# Patient Record
Sex: Female | Born: 1990 | Hispanic: Yes | Marital: Single | Smoking: Never smoker
Health system: Southern US, Community
[De-identification: ages and names within clinical notes are randomized; demographics above are authoritative.]

## PROBLEM LIST (undated history)

## (undated) HISTORY — PX: NO PAST SURGERIES: SHX2092

---
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
## 2016-05-06 ENCOUNTER — Ambulatory Visit (INDEPENDENT_AMBULATORY_CARE_PROVIDER_SITE_OTHER): Payer: Worker's Compensation

## 2016-05-06 ENCOUNTER — Ambulatory Visit
Admission: EM | Admit: 2016-05-06 | Discharge: 2016-05-06 | Disposition: A | Discharge status: Home / Self Care | Payer: Worker's Compensation | Attending: Emergency Medicine | Admitting: Emergency Medicine

## 2016-05-06 DIAGNOSIS — S39012A Strain of muscle, fascia and tendon of lower back, initial encounter: Secondary | ICD-10-CM | POA: Diagnosis not present

## 2016-05-06 DIAGNOSIS — M62830 Muscle spasm of back: Secondary | ICD-10-CM

## 2016-05-06 MED ORDER — TRAMADOL HCL 50 MG PO TABS: ORAL_TABLET | ORAL | 0 refills | Status: DC

## 2016-05-06 MED ORDER — IBUPROFEN 800 MG PO TABS: 800.0000 mg | ORAL_TABLET | Freq: Three times a day (TID) | ORAL | 0 refills | Status: DC | PRN

## 2016-05-06 MED ORDER — TIZANIDINE HCL 4 MG PO TABS: 4.0000 mg | ORAL_TABLET | Freq: Three times a day (TID) | ORAL | 0 refills | Status: DC | PRN

## 2016-05-06 MED ORDER — KETOROLAC TROMETHAMINE 60 MG/2ML IM SOLN
60.0000 mg | Freq: Once | INTRAMUSCULAR | Status: AC
  Administered 2016-05-06: 60 mg via INTRAMUSCULAR

## 2016-05-06 NOTE — ED Provider Notes (Signed)
HPI  SUBJECTIVE:  April Mckee is a 25 y.o. female who presents with each onset of achy, sharp, right back pain starting while lifting a 20+ pound bag of candy and states that she was wrenched forward followed by pain. This occurred at 1530 today. She reports weakness in her right leg secondary to the pain and numbness down her entire right leg. She tried two ASA without improvement in symptoms. Symptoms are worse with bending forward and with any movement. She denies nausea, vomiting, fevers, saddle anesthesia, abdominal pain, urinary fecal incontinence or urinary retention. She has a past medical history of back injury, but does not recall any damage. No history of diabetes, hypertension, osteoporosis, cancer, multiple myeloma, GI bleed, kidney disease. LMP: 11/20. Denies possibility being pregnant. PMD: None.  History reviewed. No pertinent past medical history.  Past Surgical History:  Procedure Laterality Date  . NO PAST SURGERIES      History reviewed. No pertinent family history.  Social History  Substance Use Topics  . Smoking status: Never Smoker  . Smokeless tobacco: Never Used  . Alcohol use No    No current facility-administered medications for this encounter.   Current Outpatient Prescriptions:  .  ibuprofen (ADVIL,MOTRIN) 800 MG tablet, Take 1 tablet (800 mg total) by mouth every 8 (eight) hours as needed., Disp: 30 tablet, Rfl: 0 .  tiZANidine (ZANAFLEX) 4 MG tablet, Take 1 tablet (4 mg total) by mouth every 8 (eight) hours as needed for muscle spasms., Disp: 30 tablet, Rfl: 0 .  traMADol (ULTRAM) 50 MG tablet, 1-2 tabs po q 6 hr prn pain Maximum dose= 8 tablets per day, Disp: 20 tablet, Rfl: 0  No Known Allergies   ROS  As noted in HPI.   Physical Exam  BP 109/68 (BP Location: Right Arm)   Pulse 81   Temp 98.6 F (37 C) (Oral)   Resp 17   Ht 5' 1"  (1.549 m)   Wt 160 lb (72.6 kg)   LMP 04/21/2016   SpO2 99%   BMI 30.23 kg/m   Constitutional: Well  developed, well nourished, no acute distress Eyes:  EOMI, conjunctiva normal bilaterally HENT: Normocephalic, atraumatic,mucus membranes moist Respiratory: Normal inspiratory effort Cardiovascular: Normal rate GI: nondistended. No suprapubic tenderness skin: No rash, skin intact Musculoskeletal: no CVAT. + R paralumbar tenderness, +  muscle spasm. No bony tenderness. Bilateral lower extremities nontender, baseline ROM with intact DP pulses,. No pain with int/ext rotation. Pain aggravated with active hip flexion against resistance bilaterally.  Pain aggravated by right straight leg raise, but SLR neg bilaterally. Sensation baseline light touch bilaterally for Pt, DTR's symmetric and intact bilaterally KJ , Motor symmetric bilateral 5/5 hip flexion, quadriceps, hamstrings, EHL, foot dorsiflexion, foot plantarflexion, gait somewhat antalgic but without apparent new ataxia. Neurologic: Alert & oriented x 3, no focal neuro deficits Psychiatric: Speech and behavior appropriate   ED Course   Medications  ketorolac (TORADOL) injection 60 mg (60 mg Intramuscular Given 05/06/16 1711)    Orders Placed This Encounter  Procedures  . DG Lumbar Spine Complete    Standing Status:   Standing    Number of Occurrences:   1    Order Specific Question:   Reason for Exam (SYMPTOM  OR DIAGNOSIS REQUIRED)    Answer:   R LBP r/o fx dislocation    No results found for this or any previous visit (from the past 46 hour(s)). Dg Lumbar Spine Complete  Result Date: 05/06/2016 CLINICAL DATA:  25 year old female with  a jerking motion during working developing pain in lower right lumbar region. Initial encounter. EXAM: LUMBAR SPINE - COMPLETE 4+ VIEW COMPARISON:  None. FINDINGS: Mild scoliosis convex to the right. No fracture detected. No pars defect noted. No significant disc space narrowing. IMPRESSION: Mild scoliosis convex right. No fracture noted. Electronically Signed   By: Genia Del M.D.   On: 05/06/2016  18:27    ED Clinical Impression  Strain of lumbar region, initial encounter  Muscle spasm of back   ED Assessment/Plan  [place] narcotic database reviewed. Pt with no narcotic rx in the past 6 months.  .   Toradol 60 mg IM given 1.  Reviewed imaging independently. No fracture, dislocation. See radiology report for details.  Presentation most consistent with acute lumbar sprain/strain. Home with tramadol, ibuprofen, muscle relaxant, follow-up with tommie anne more, occupational health in 3 days. Writing for light duty for the next several days.    Discussed imaging, medical decision-making, and plan for follow-up with the patient.  Discussed signs and symptoms that should prompt return to the emergency department.  Patient agrees with plan.   *This clinic note was created using Dragon dictation software. Therefore, there may be occasional mistakes despite careful proofreading.  ?    Melynda Ripple, MD 05/06/16 [phone]

## 2016-05-06 NOTE — ED Notes (Signed)
Urine Drug screen obtained in office.

## 2016-05-06 NOTE — ED Triage Notes (Signed)
Patient complains of extreme back pain that started around 1 hour ago. Patient states that she was at work and was picking up a heavy load of candy to throw away and she was placing it on a cart, the cart then took off away from her and her body jerked while holding the heavy candy bag. Patient reports that she has numbness radiating down her right leg.

## 2016-05-09 ENCOUNTER — Telehealth: Payer: Self-pay

## 2016-05-09 NOTE — Telephone Encounter (Signed)
Courtesy call back completed today for patient's recent visit at [facility]. Patient did not answer, left message on machine to call back with any questions or concerns.   

## 2016-05-22 ENCOUNTER — Other Ambulatory Visit: Payer: Self-pay | Admitting: Family

## 2016-05-22 ENCOUNTER — Encounter: Payer: Self-pay | Admitting: *Deleted

## 2016-05-22 ENCOUNTER — Emergency Department
Admission: EM | Admit: 2016-05-22 | Discharge: 2016-05-22 | Disposition: A | Discharge status: Home / Self Care | Payer: Worker's Compensation | Attending: Student in an Organized Health Care Education/Training Program | Admitting: Student in an Organized Health Care Education/Training Program

## 2016-05-22 DIAGNOSIS — S3992XD Unspecified injury of lower back, subsequent encounter: Secondary | ICD-10-CM | POA: Diagnosis present

## 2016-05-22 DIAGNOSIS — Z79899 Other long term (current) drug therapy: Secondary | ICD-10-CM | POA: Diagnosis not present

## 2016-05-22 DIAGNOSIS — M5416 Radiculopathy, lumbar region: Secondary | ICD-10-CM

## 2016-05-22 DIAGNOSIS — Z791 Long term (current) use of non-steroidal anti-inflammatories (NSAID): Secondary | ICD-10-CM | POA: Insufficient documentation

## 2016-05-22 DIAGNOSIS — S39012D Strain of muscle, fascia and tendon of lower back, subsequent encounter: Secondary | ICD-10-CM | POA: Diagnosis not present

## 2016-05-22 DIAGNOSIS — X500XXD Overexertion from strenuous movement or load, subsequent encounter: Secondary | ICD-10-CM | POA: Insufficient documentation

## 2016-05-22 MED ORDER — HYDROMORPHONE HCL 1 MG/ML IJ SOLN
1.0000 mg | Freq: Once | INTRAMUSCULAR | Status: AC
  Administered 2016-05-22: 1 mg via INTRAMUSCULAR
  Filled 2016-05-22: qty 1

## 2016-05-22 MED ORDER — ORPHENADRINE CITRATE 30 MG/ML IJ SOLN
60.0000 mg | Freq: Two times a day (BID) | INTRAMUSCULAR | Status: DC
  Administered 2016-05-22: 60 mg via INTRAMUSCULAR
  Filled 2016-05-22: qty 2

## 2016-05-22 MED ORDER — NAPROXEN 500 MG PO TABS: 500.0000 mg | ORAL_TABLET | Freq: Two times a day (BID) | ORAL | Status: DC

## 2016-05-22 MED ORDER — METHOCARBAMOL 750 MG PO TABS: 750.0000 mg | ORAL_TABLET | Freq: Four times a day (QID) | ORAL | 0 refills | Status: DC

## 2016-05-22 NOTE — ED Notes (Signed)
See triage note  States she was lifting a box ar work on 12/5  Developed lower back pain  [facility] Then states pain started moving into right leg   Was seen yesterday by Ortho and placed on prednisone taper  States pain is worse this am and she voided on self this am denies any new injury

## 2016-05-22 NOTE — ED Provider Notes (Signed)
[facility] Emergency Department Provider Note   ____________________________________________   First MD Initiated Contact with Patient 05/22/16 1113     (approximate)  I have reviewed the triage vital signs and the nursing notes.   HISTORY  Chief Complaint Back Pain and Leg Pain    HPI April Mckee is a 25 y.o. female patient complaining of continued low back pain with radicular component to the right lower extremity. Plan is secondary to lifting incident at work on 05/06/2016. Patient had imaging done her back which shows only mild scoliosis convex to the right side. Patient has been seen by orthopedics and they started her on a prednisone dose pack yesterday. Patient also has a consult to physical therapy. Patient state noticed no improvement after starting the prednisone yesterday. She denies bowel dysfunction. Patient states she had one episode of urinary incontinence this morning. Patient has voided since the morning  episode without loss control the bladder. Patient rates her pain as a 9/10. Patient described the pain as "achy".   History reviewed. No pertinent past medical history.  There are no active problems to display for this patient.   Past Surgical History:  Procedure Laterality Date  . NO PAST SURGERIES      Prior to Admission medications   Medication Sig Start Date End Date Taking? Authorizing Provider  ibuprofen (ADVIL,MOTRIN) 800 MG tablet Take 1 tablet (800 mg total) by mouth every 8 (eight) hours as needed. 05/06/16   Domenick GongAshley Mortenson, MD  methocarbamol (ROBAXIN-750) 750 MG tablet Take 1 tablet (750 mg total) by mouth 4 (four) times daily. 05/22/16   Joni Reiningonald K Smith, PA-C  naproxen (NAPROSYN) 500 MG tablet Take 1 tablet (500 mg total) by mouth 2 (two) times daily with a meal. 05/22/16   Joni Reiningonald K Smith, PA-C  tiZANidine (ZANAFLEX) 4 MG tablet Take 1 tablet (4 mg total) by mouth every 8 (eight) hours as needed for muscle spasms. 05/06/16    Domenick GongAshley Mortenson, MD  traMADol (ULTRAM) 50 MG tablet 1-2 tabs po q 6 hr prn pain Maximum dose= 8 tablets per day 05/06/16   Domenick GongAshley Mortenson, MD    Allergies Patient has no known allergies.  History reviewed. No pertinent family history.  Social History Social History  Substance Use Topics  . Smoking status: Never Smoker  . Smokeless tobacco: Never Used  . Alcohol use No    Review of Systems Constitutional: No fever/chills Eyes: No visual changes. ENT: No sore throat. Cardiovascular: Denies chest pain. Respiratory: Denies shortness of breath. Gastrointestinal: No abdominal pain.  No nausea, no vomiting.  No diarrhea.  No constipation. Genitourinary: Negative for dysuria. Musculoskeletal: Positive for back pain. Skin: Negative for rash. Neurological: Negative for headaches, focal weakness or numbness.    ____________________________________________   PHYSICAL EXAM:  VITAL SIGNS: ED Triage Vitals  Enc Vitals Group     BP 05/22/16 1059 104/67     Pulse Rate 05/22/16 1059 60     Resp 05/22/16 1059 18     Temp 05/22/16 1059 98 F (36.7 C)     Temp Source 05/22/16 1059 Oral     SpO2 05/22/16 1059 100 %     Weight 05/22/16 1059 160 lb (72.6 kg)     Height 05/22/16 1059 5\' 1"  (1.549 m)     Head Circumference --      Peak Flow --      Pain Score 05/22/16 1100 9     Pain Loc --  Pain Edu? --      Excl. in GC? --     Constitutional: Alert and oriented. Well appearing and in no acute distress.Patient is is sitting on her left leg in a wheelchair upon my entrance into the exam room.  Eyes: Conjunctivae are normal. PERRL. EOMI. Head: Atraumatic. Nose: No congestion/rhinnorhea. Mouth/Throat: Mucous membranes are moist.  Oropharynx non-erythematous. Neck: No stridor.  No cervical spine tenderness to palpation. Hematological/Lymphatic/Immunilogical: No cervical lymphadenopathy. Cardiovascular: Normal rate, regular rhythm. Grossly normal heart sounds.  Good peripheral  circulation. Respiratory: Normal respiratory effort.  No retractions. Lungs CTAB. Gastrointestinal: Soft and nontender. No distention. No abdominal bruits. No CVA tenderness. Musculoskeletal: No obvious spinal deformity. No guarding palpation spinal processes. Patient has negative straight leg test. Patient has atypical gait without ataxia. Decreased range of motion with extension secondary to complain of pain. Right paraspinal muscle spasms with left lateral movements.  Neurologic:  Normal speech and language. No gross focal neurologic deficits are appreciated. No gait instability. Skin:  Skin is warm, dry and intact. No rash noted. Psychiatric: Mood and affect are normal. Speech and behavior are normal.  ____________________________________________   LABS (all labs ordered are listed, but only abnormal results are displayed)  Labs Reviewed - No data to display ____________________________________________  EKG   ____________________________________________  RADIOLOGY  Reviewed x-ray taken on 12 50 17 shows no acute findings. ____________________________________________   PROCEDURES  Procedure(s) performed: None  Procedures  Critical Care performed: No  ____________________________________________   INITIAL IMPRESSION / ASSESSMENT AND PLAN / ED COURSE  Pertinent labs & imaging results that were available during my care of the patient were reviewed by me and considered in my medical decision making (see chart for details).  Lumbar strain. Patient given discharge care instructions. Advised to follow-up with scheduled physical therapy and continue taking penicillin as directed. Patient given a prescription for Robaxin and naproxen. Advised to follow-up with treating doctor for his Worker's Comp. injury for continued care.  Clinical Course      ____________________________________________   FINAL CLINICAL IMPRESSION(S) / ED DIAGNOSES  Final diagnoses:  Strain of  lumbar region, subsequent encounter      NEW MEDICATIONS STARTED DURING THIS VISIT:  New Prescriptions   METHOCARBAMOL (ROBAXIN-750) 750 MG TABLET    Take 1 tablet (750 mg total) by mouth 4 (four) times daily.   NAPROXEN (NAPROSYN) 500 MG TABLET    Take 1 tablet (500 mg total) by mouth 2 (two) times daily with a meal.     Note:  This document was prepared using Dragon voice recognition software and may include unintentional dictation errors.    Joni Reiningonald K Smith, PA-C 05/22/16 1148    Willy EddyPatrick Robinson, MD 05/22/16 [phone]

## 2016-05-22 NOTE — ED Triage Notes (Signed)
States she had an injury on 12/5 at work and is having continued right sided back pain and leg pain, awake and alert

## 2016-05-23 ENCOUNTER — Ambulatory Visit: Payer: Self-pay

## 2016-05-24 ENCOUNTER — Ambulatory Visit
Admission: RE | Admit: 2016-05-24 | Discharge: 2016-05-24 | Disposition: A | Discharge status: Home / Self Care | Payer: Worker's Compensation | Source: Ambulatory Visit | Attending: Family | Admitting: Family

## 2016-05-24 DIAGNOSIS — M5126 Other intervertebral disc displacement, lumbar region: Secondary | ICD-10-CM | POA: Insufficient documentation

## 2016-05-24 DIAGNOSIS — R200 Anesthesia of skin: Secondary | ICD-10-CM | POA: Insufficient documentation

## 2016-05-24 DIAGNOSIS — R531 Weakness: Secondary | ICD-10-CM | POA: Insufficient documentation

## 2016-05-24 DIAGNOSIS — M5416 Radiculopathy, lumbar region: Secondary | ICD-10-CM | POA: Diagnosis not present

## 2016-09-15 ENCOUNTER — Other Ambulatory Visit: Payer: Self-pay | Admitting: Nurse Practitioner

## 2016-09-15 DIAGNOSIS — Z3401 Encounter for supervision of normal first pregnancy, first trimester: Secondary | ICD-10-CM

## 2016-09-19 ENCOUNTER — Emergency Department: Payer: Medicaid Other | Admitting: Certified Registered"

## 2016-09-19 ENCOUNTER — Encounter: Payer: Self-pay | Admitting: Emergency Medicine

## 2016-09-19 ENCOUNTER — Encounter
Admission: EM | Disposition: A | Discharge status: Home / Self Care | Payer: Self-pay | Source: Home / Self Care | Attending: Emergency Medicine

## 2016-09-19 ENCOUNTER — Emergency Department: Payer: Medicaid Other

## 2016-09-19 ENCOUNTER — Observation Stay
Admission: EM | Admit: 2016-09-19 | Discharge: 2016-09-20 | Disposition: A | Discharge status: Home / Self Care | Payer: Medicaid Other | Attending: Obstetrics & Gynecology | Admitting: Obstetrics & Gynecology

## 2016-09-19 DIAGNOSIS — Z3A00 Weeks of gestation of pregnancy not specified: Secondary | ICD-10-CM | POA: Diagnosis not present

## 2016-09-19 DIAGNOSIS — Z8759 Personal history of other complications of pregnancy, childbirth and the puerperium: Secondary | ICD-10-CM

## 2016-09-19 DIAGNOSIS — O00101 Right tubal pregnancy without intrauterine pregnancy: Secondary | ICD-10-CM | POA: Diagnosis present

## 2016-09-19 DIAGNOSIS — Z79899 Other long term (current) drug therapy: Secondary | ICD-10-CM | POA: Insufficient documentation

## 2016-09-19 DIAGNOSIS — O009 Unspecified ectopic pregnancy without intrauterine pregnancy: Secondary | ICD-10-CM | POA: Diagnosis present

## 2016-09-19 HISTORY — PX: LAPAROSCOPIC UNILATERAL SALPINGECTOMY: SHX5934

## 2016-09-19 HISTORY — PX: DIAGNOSTIC LAPAROSCOPY WITH REMOVAL OF ECTOPIC PREGNANCY: SHX6449

## 2016-09-19 LAB — CBC
HEMATOCRIT: 35.7 % (ref 35.0–47.0)
Hemoglobin: 12.3 g/dL (ref 12.0–16.0)
MCH: 31.2 pg (ref 26.0–34.0)
MCHC: 34.5 g/dL (ref 32.0–36.0)
MCV: 90.5 fL (ref 80.0–100.0)
Platelets: 323 10*3/uL (ref 150–440)
RBC: 3.95 MIL/uL (ref 3.80–5.20)
RDW: 12.5 % (ref 11.5–14.5)
WBC: 7.5 10*3/uL (ref 3.6–11.0)

## 2016-09-19 LAB — BASIC METABOLIC PANEL
Anion gap: 9 (ref 5–15)
BUN: 8 mg/dL (ref 6–20)
CALCIUM: 8.9 mg/dL (ref 8.9–10.3)
CO2: 22 mmol/L (ref 22–32)
CREATININE: 0.71 mg/dL (ref 0.44–1.00)
Chloride: 105 mmol/L (ref 101–111)
GFR calc non Af Amer: >60 mL/min (ref >60)
GLUCOSE: 137 mg/dL — AB (ref 65–99)
Potassium: 3 mmol/L — ABNORMAL LOW (ref 3.5–5.1)
Sodium: 136 mmol/L (ref 135–145)

## 2016-09-19 LAB — HCG, QUANTITATIVE, PREGNANCY: hCG, Beta Chain, Quant, S: 20506 m[IU]/mL — ABNORMAL HIGH (ref <5)

## 2016-09-19 LAB — HEMOGLOBIN: HEMOGLOBIN: 9.3 g/dL — AB (ref 12.0–16.0)

## 2016-09-19 LAB — PREPARE RBC (CROSSMATCH)

## 2016-09-19 LAB — TYPE AND SCREEN
ABO/RH(D): B POS
Antibody Screen: NEGATIVE

## 2016-09-19 LAB — ABO/RH: ABO/RH(D): B POS

## 2016-09-19 SURGERY — LAPAROSCOPY, WITH ECTOPIC PREGNANCY SURGICAL TREATMENT
Anesthesia: General | Site: Uterus | Laterality: Right | Wound class: Clean

## 2016-09-19 MED ORDER — LACTATED RINGERS IV SOLN
INTRAVENOUS | Status: DC
  Administered 2016-09-19 – 2016-09-20 (×2): via INTRAVENOUS

## 2016-09-19 MED ORDER — OXYCODONE-ACETAMINOPHEN 5-325 MG PO TABS
1.0000 | ORAL_TABLET | ORAL | Status: DC | PRN
  Administered 2016-09-19 – 2016-09-20 (×4): 2 via ORAL
  Filled 2016-09-19 (×4): qty 2

## 2016-09-19 MED ORDER — LIDOCAINE HCL (CARDIAC) 20 MG/ML IV SOLN
INTRAVENOUS | Status: DC | PRN
  Administered 2016-09-19: 50 mg via INTRAVENOUS

## 2016-09-19 MED ORDER — ONDANSETRON HCL 4 MG/2ML IJ SOLN
INTRAMUSCULAR | Status: AC
  Filled 2016-09-19: qty 2

## 2016-09-19 MED ORDER — SUCCINYLCHOLINE CHLORIDE 20 MG/ML IJ SOLN
INTRAMUSCULAR | Status: DC | PRN
  Administered 2016-09-19: 100 mg via INTRAVENOUS

## 2016-09-19 MED ORDER — ACETAMINOPHEN 10 MG/ML IV SOLN
INTRAVENOUS | Status: AC
  Filled 2016-09-19: qty 100

## 2016-09-19 MED ORDER — ROCURONIUM BROMIDE 100 MG/10ML IV SOLN
INTRAVENOUS | Status: AC
  Filled 2016-09-19: qty 1

## 2016-09-19 MED ORDER — MIDAZOLAM HCL 2 MG/2ML IJ SOLN
INTRAMUSCULAR | Status: AC
  Filled 2016-09-19: qty 2

## 2016-09-19 MED ORDER — MIDAZOLAM HCL 2 MG/2ML IJ SOLN
INTRAMUSCULAR | Status: DC | PRN
  Administered 2016-09-19: 2 mg via INTRAVENOUS

## 2016-09-19 MED ORDER — PROPOFOL 10 MG/ML IV BOLUS
INTRAVENOUS | Status: AC
  Filled 2016-09-19: qty 20

## 2016-09-19 MED ORDER — MORPHINE SULFATE (PF) 2 MG/ML IV SOLN
2.0000 mg | Freq: Once | INTRAVENOUS | Status: AC
  Administered 2016-09-19: 2 mg via INTRAVENOUS

## 2016-09-19 MED ORDER — PROPOFOL 10 MG/ML IV BOLUS
INTRAVENOUS | Status: DC | PRN
  Administered 2016-09-19: 200 mg via INTRAVENOUS

## 2016-09-19 MED ORDER — MORPHINE SULFATE (PF) 4 MG/ML IV SOLN
INTRAVENOUS | Status: AC
  Administered 2016-09-19: 4 mg via INTRAVENOUS
  Filled 2016-09-19: qty 1

## 2016-09-19 MED ORDER — FENTANYL CITRATE (PF) 100 MCG/2ML IJ SOLN
INTRAMUSCULAR | Status: AC
  Administered 2016-09-19: 25 ug via INTRAVENOUS
  Filled 2016-09-19: qty 2

## 2016-09-19 MED ORDER — ACETAMINOPHEN 10 MG/ML IV SOLN
INTRAVENOUS | Status: DC | PRN
  Administered 2016-09-19: 1000 mg via INTRAVENOUS

## 2016-09-19 MED ORDER — FENTANYL CITRATE (PF) 250 MCG/5ML IJ SOLN
INTRAMUSCULAR | Status: AC
  Filled 2016-09-19: qty 5

## 2016-09-19 MED ORDER — LACTATED RINGERS IV SOLN
INTRAVENOUS | Status: DC | PRN
  Administered 2016-09-19 (×2): via INTRAVENOUS

## 2016-09-19 MED ORDER — DEXAMETHASONE SODIUM PHOSPHATE 10 MG/ML IJ SOLN
INTRAMUSCULAR | Status: AC
  Filled 2016-09-19: qty 1

## 2016-09-19 MED ORDER — SODIUM CHLORIDE 0.9 % IV BOLUS (SEPSIS)
1000.0000 mL | Freq: Once | INTRAVENOUS | Status: AC
  Administered 2016-09-19: 1000 mL via INTRAVENOUS

## 2016-09-19 MED ORDER — EPINEPHRINE PF 1 MG/ML IJ SOLN
INTRAMUSCULAR | Status: AC
  Filled 2016-09-19: qty 1

## 2016-09-19 MED ORDER — ONDANSETRON HCL 4 MG/2ML IJ SOLN
INTRAMUSCULAR | Status: DC | PRN
  Administered 2016-09-19: 4 mg via INTRAVENOUS

## 2016-09-19 MED ORDER — SODIUM CHLORIDE 0.9 % IV SOLN: 10.0000 mL/h | Freq: Once | INTRAVENOUS | Status: DC

## 2016-09-19 MED ORDER — ROCURONIUM BROMIDE 100 MG/10ML IV SOLN
INTRAVENOUS | Status: DC | PRN
  Administered 2016-09-19: 40 mg via INTRAVENOUS
  Administered 2016-09-19: 10 mg via INTRAVENOUS

## 2016-09-19 MED ORDER — DEXAMETHASONE SODIUM PHOSPHATE 10 MG/ML IJ SOLN
INTRAMUSCULAR | Status: DC | PRN
  Administered 2016-09-19: 10 mg via INTRAVENOUS

## 2016-09-19 MED ORDER — ONDANSETRON HCL 4 MG/2ML IJ SOLN
4.0000 mg | Freq: Four times a day (QID) | INTRAMUSCULAR | Status: DC | PRN
Start: 2016-09-19 — End: 2016-09-20
  Administered 2016-09-19: 4 mg via INTRAVENOUS
  Filled 2016-09-19: qty 2

## 2016-09-19 MED ORDER — MORPHINE SULFATE (PF) 2 MG/ML IV SOLN
1.0000 mg | INTRAVENOUS | Status: DC | PRN
  Administered 2016-09-19: 2 mg via INTRAVENOUS
  Filled 2016-09-19: qty 1

## 2016-09-19 MED ORDER — EPHEDRINE SULFATE 50 MG/ML IJ SOLN
INTRAMUSCULAR | Status: DC | PRN
  Administered 2016-09-19 (×2): 10 mg via INTRAVENOUS

## 2016-09-19 MED ORDER — FENTANYL CITRATE (PF) 100 MCG/2ML IJ SOLN
25.0000 ug | INTRAMUSCULAR | Status: AC | PRN
  Administered 2016-09-19 (×6): 25 ug via INTRAVENOUS

## 2016-09-19 MED ORDER — FENTANYL CITRATE (PF) 100 MCG/2ML IJ SOLN
INTRAMUSCULAR | Status: DC | PRN
  Administered 2016-09-19 (×3): 50 ug via INTRAVENOUS
  Administered 2016-09-19: 100 ug via INTRAVENOUS

## 2016-09-19 MED ORDER — ONDANSETRON HCL 4 MG/2ML IJ SOLN: 4.0000 mg | Freq: Once | INTRAMUSCULAR | Status: DC | PRN

## 2016-09-19 MED ORDER — SUGAMMADEX SODIUM 200 MG/2ML IV SOLN
INTRAVENOUS | Status: DC | PRN
  Administered 2016-09-19: 150 mg via INTRAVENOUS

## 2016-09-19 MED ORDER — PHENYLEPHRINE HCL 10 MG/ML IJ SOLN
INTRAMUSCULAR | Status: DC | PRN
  Administered 2016-09-19 (×3): 100 ug via INTRAVENOUS
  Administered 2016-09-19 (×2): 200 ug via INTRAVENOUS
  Administered 2016-09-19 (×2): 100 ug via INTRAVENOUS

## 2016-09-19 MED ORDER — SIMETHICONE 80 MG PO CHEW
80.0000 mg | CHEWABLE_TABLET | Freq: Four times a day (QID) | ORAL | Status: DC | PRN
Start: 2016-09-19 — End: 2016-09-20

## 2016-09-19 MED ORDER — BUPIVACAINE HCL (PF) 0.5 % IJ SOLN
INTRAMUSCULAR | Status: DC | PRN
  Administered 2016-09-19: 10 mL

## 2016-09-19 MED ORDER — LIDOCAINE HCL (PF) 2 % IJ SOLN
INTRAMUSCULAR | Status: AC
  Filled 2016-09-19: qty 2

## 2016-09-19 MED ORDER — ONDANSETRON HCL 4 MG PO TABS: 4.0000 mg | ORAL_TABLET | Freq: Four times a day (QID) | ORAL | Status: DC | PRN

## 2016-09-19 MED ORDER — ACETAMINOPHEN 325 MG PO TABS: 650.0000 mg | ORAL_TABLET | ORAL | Status: DC | PRN

## 2016-09-19 MED ORDER — ONDANSETRON HCL 4 MG/2ML IJ SOLN
INTRAMUSCULAR | Status: AC
  Administered 2016-09-19: 4 mg via INTRAVENOUS
  Filled 2016-09-19: qty 2

## 2016-09-19 MED ORDER — MORPHINE SULFATE (PF) 2 MG/ML IV SOLN
INTRAVENOUS | Status: AC
  Administered 2016-09-19: 2 mg via INTRAVENOUS
  Filled 2016-09-19: qty 1

## 2016-09-19 MED ORDER — SUGAMMADEX SODIUM 200 MG/2ML IV SOLN
INTRAVENOUS | Status: AC
  Filled 2016-09-19: qty 2

## 2016-09-19 MED ORDER — MORPHINE SULFATE (PF) 4 MG/ML IV SOLN
4.0000 mg | Freq: Once | INTRAVENOUS | Status: AC
  Administered 2016-09-19: 4 mg via INTRAVENOUS

## 2016-09-19 MED ORDER — ONDANSETRON HCL 4 MG/2ML IJ SOLN
4.0000 mg | Freq: Once | INTRAMUSCULAR | Status: AC
  Administered 2016-09-19: 4 mg via INTRAVENOUS

## 2016-09-19 SURGICAL SUPPLY — 42 items
ANCHOR TIS RET SYS 235ML (MISCELLANEOUS) ×4 IMPLANT
APPLICATOR COTTON TIP 6IN STRL (MISCELLANEOUS) ×4 IMPLANT
BLADE SURG SZ11 CARB STEEL (BLADE) ×4 IMPLANT
CANISTER SUCT 1200ML W/VALVE (MISCELLANEOUS) ×4 IMPLANT
CATH FOLEY 2WAY  5CC 16FR (CATHETERS) ×2
CATH ROBINSON RED A/P 16FR (CATHETERS) ×4 IMPLANT
CATH URTH 16FR FL 2W BLN LF (CATHETERS) ×2 IMPLANT
CHLORAPREP W/TINT 26ML (MISCELLANEOUS) ×4 IMPLANT
DERMABOND ADVANCED (GAUZE/BANDAGES/DRESSINGS) ×2
DERMABOND ADVANCED .7 DNX12 (GAUZE/BANDAGES/DRESSINGS) ×2 IMPLANT
DRSG TEGADERM 2-3/8X2-3/4 SM (GAUZE/BANDAGES/DRESSINGS) ×12 IMPLANT
DRSG TELFA 4X3 1S NADH ST (GAUZE/BANDAGES/DRESSINGS) ×4 IMPLANT
ENDOPOUCH RETRIEVER 10 (MISCELLANEOUS) ×4 IMPLANT
GAUZE SPONGE NON-WVN 2X2 STRL (MISCELLANEOUS) ×2 IMPLANT
GLOVE BIO SURGEON STRL SZ8 (GLOVE) ×4 IMPLANT
GLOVE INDICATOR 8.0 STRL GRN (GLOVE) ×4 IMPLANT
GOWN STRL REUS W/ TWL LRG LVL3 (GOWN DISPOSABLE) ×2 IMPLANT
GOWN STRL REUS W/ TWL XL LVL3 (GOWN DISPOSABLE) ×2 IMPLANT
GOWN STRL REUS W/TWL LRG LVL3 (GOWN DISPOSABLE) ×2
GOWN STRL REUS W/TWL XL LVL3 (GOWN DISPOSABLE) ×2
IRRIGATION STRYKERFLOW (MISCELLANEOUS) ×2 IMPLANT
IRRIGATOR STRYKERFLOW (MISCELLANEOUS) ×4
IV LACTATED RINGERS 1000ML (IV SOLUTION) ×4 IMPLANT
KIT PINK PAD W/HEAD ARE REST (MISCELLANEOUS) ×4
KIT PINK PAD W/HEAD ARM REST (MISCELLANEOUS) ×2 IMPLANT
LABEL OR SOLS (LABEL) ×4 IMPLANT
NEEDLE VERESS 14GA 120MM (NEEDLE) ×4 IMPLANT
NS IRRIG 500ML POUR BTL (IV SOLUTION) ×4 IMPLANT
PACK GYN LAPAROSCOPIC (MISCELLANEOUS) ×4 IMPLANT
PAD PREP 24X41 OB/GYN DISP (PERSONAL CARE ITEMS) ×4 IMPLANT
SCISSORS METZENBAUM CVD 33 (INSTRUMENTS) ×4 IMPLANT
SHEARS HARMONIC ACE PLUS 36CM (ENDOMECHANICALS) ×4 IMPLANT
SLEEVE ENDOPATH XCEL 5M (ENDOMECHANICALS) ×4 IMPLANT
SPONGE VERSALON 2X2 STRL (MISCELLANEOUS) ×2
STRAP SAFETY BODY (MISCELLANEOUS) ×4 IMPLANT
SUT VIC AB 2-0 UR6 27 (SUTURE) ×4 IMPLANT
SUT VIC AB 4-0 PS2 18 (SUTURE) ×4 IMPLANT
SUT VICRYL 0 AB UR-6 (SUTURE) ×4 IMPLANT
SYRINGE 10CC LL (SYRINGE) ×4 IMPLANT
TROCAR ENDO BLADELESS 11MM (ENDOMECHANICALS) ×4 IMPLANT
TROCAR XCEL NON-BLD 5MMX100MML (ENDOMECHANICALS) ×4 IMPLANT
TUBING INSUFFLATOR HI FLOW (MISCELLANEOUS) ×4 IMPLANT

## 2016-09-19 NOTE — ED Notes (Signed)
Last PO intake today at approximately 1100.  Patient states she ate eggs.

## 2016-09-19 NOTE — ED Notes (Signed)
2L/NS running.  Dr. Cyril Loosen notified of BP 80/47

## 2016-09-19 NOTE — ED Notes (Signed)
Dr. Roxan Hockey in room, c/o lower abd pain without bleeding or DC. Has not had an [place] to confirm.

## 2016-09-19 NOTE — Op Note (Signed)
  Operative Note   09/19/2016  PRE-OP DIAGNOSIS: RIGHT ECTOPIC PREGNANCY  POST-OP DIAGNOSIS: same   PROCEDURE: Procedure(s): DIAGNOSTIC LAPAROSCOPY WITH REMOVAL OF ECTOPIC PREGNANCY LAPAROSCOPIC RIGHT SALPINGECTOMY   SURGEON: Annamarie Major, MD, FACOG  ANESTHESIA: General   ESTIMATED BLOOD LOSS: 1500 mL blood removed from ruptured ectopic pregnancy  COMPLICATIONS: None  DISPOSITION: PACU - hemodynamically stable.  CONDITION: stable  FINDINGS: Laparoscopic survey of the abdomen revealed significant pelvic and intra-abdominal adhesions were noted.  Ovaries were encased in adhesions, but seen as normal size.  RIGHT FALLOPIAN TUBE severely edematous and actively bleeding bright red blood from point of rupture.  Normal appendix, liver, GB.  PROCEDURE IN DETAIL: The patient was taken to the OR where anesthesia was administed. The patient was positioned in dorsal lithotomy in the [place] stirrups. The patient was then examined under anesthesia with the above noted findings. The patient was prepped and draped in the normal sterile fashion and bladder was drained using a red rubber cathater. Speculum exam normal, and a  Hulka tenaculum was placed for manipulation purposes.  Attention was turned to the patient's abdomen where a 5 mm skin incision was made in the umbilical fold, after injection of local anesthesia. The Veress step needle was carefully introduced into the peritoneal cavity with placement confirmed using the hanging drop technique.  Pneumoperitoneum was obtained. The 5 mm port was then placed under direct visualization with the operative laparoscope  The above noted findings.  Trendelenburg.  A 5 mm trocar was then placed in the left lower quadrant under direct visualization with the laparoscope. A suprapubic 11 mm trocar was also placed.   Above mentioned findings were observed.  Aspiration of blood and clot performed.  Right tube damaged beyond repair from ectopic and rupture, so it is  carefully excised using the harmonic scapel.  Its blood supply is transected, with preservation of main blood supply to ovary.  No injury to bowel or bladder or ureter observed.  No further bleeding was noted.  All instruments and ports were then removed from the abdomen after gas was expelled and patient was leveled.  The fascia at the larger incision is closed with a 0 Vicryl suture and skin with 4-0 Vicryl suture.  The skin was closed with  skin adhesive. The patient tolerated the procedure well. Hulka tenaculum removed.  All counts were correct x 2. The patient was transferred to the recovery room awake, alert and breathing independently.

## 2016-09-19 NOTE — Anesthesia Procedure Notes (Signed)
Procedure Name: Intubation Performed by: Mathews Argyle Pre-anesthesia Checklist: Patient identified, Patient being monitored, Timeout performed, Emergency Drugs available and Suction available Patient Re-evaluated:Patient Re-evaluated prior to inductionOxygen Delivery Method: Circle system utilized Preoxygenation: Pre-oxygenation with 100% oxygen Intubation Type: IV induction, Rapid sequence and Cricoid Pressure applied Laryngoscope Size: Miller and 2 Grade View: Grade I Tube type: Oral Tube size: 7.0 mm Number of attempts: 1 Placement Confirmation: ETT inserted through vocal cords under direct vision,  positive ETCO2 and breath sounds checked- equal and bilateral Secured at: 20 cm Tube secured with: Tape Dental Injury: Teeth and Oropharynx as per pre-operative assessment

## 2016-09-19 NOTE — ED Triage Notes (Signed)
Pt to ED c/o lower abd cramping started 30 min PTA that is constant.  Patient is pregnant, LMP approx January 31st, first pregnancy.  Denies bleeding.  Pt reports feeling weak and dehydrated.

## 2016-09-19 NOTE — Anesthesia Postprocedure Evaluation (Signed)
Anesthesia Post Note  Patient: April Mckee  Procedure(s) Performed: Procedure(s) (LRB): DIAGNOSTIC LAPAROSCOPY WITH REMOVAL OF ECTOPIC PREGNANCY (Right) LAPAROSCOPIC RIGHT SALPINGECTOMY (Right)  Patient location during evaluation: PACU Anesthesia Type: General Level of consciousness: awake and alert Pain management: pain level controlled Vital Signs Assessment: post-procedure vital signs reviewed and stable Respiratory status: spontaneous breathing, nonlabored ventilation, respiratory function stable and patient connected to nasal cannula oxygen Cardiovascular status: blood pressure returned to baseline and stable Postop Assessment: no signs of nausea or vomiting Anesthetic complications: no     Last Vitals:  Vitals:   09/19/16 1719 09/19/16 1724  BP:  120/79  Pulse: (!) 117   Resp: 10   Temp:  36.3 C    Last Pain:  Vitals:   09/19/16 1724  TempSrc:   PainSc: 5                  Cleda Mccreedy Piscitello

## 2016-09-19 NOTE — Anesthesia Preprocedure Evaluation (Signed)
Anesthesia Evaluation  Patient identified by MRN, date of birth, ID band Patient awake    Reviewed: Allergy & Precautions, NPO status , Patient's Chart, lab work & pertinent test results, reviewed documented beta blocker date and time   Airway Mallampati: III  TM Distance: >3 FB     Dental  (+) Chipped   Pulmonary           Cardiovascular      Neuro/Psych    GI/Hepatic   Endo/Other    Renal/GU      Musculoskeletal   Abdominal   Peds  Hematology   Anesthesia Other Findings   Reproductive/Obstetrics                             Anesthesia Physical Anesthesia Plan  ASA: III  Anesthesia Plan: General   Post-op Pain Management:    Induction: Intravenous, Rapid sequence and Cricoid pressure planned  Airway Management Planned: Oral ETT  Additional Equipment:   Intra-op Plan:   Post-operative Plan:   Informed Consent: I have reviewed the patients History and Physical, chart, labs and discussed the procedure including the risks, benefits and alternatives for the proposed anesthesia with the patient or authorized representative who has indicated his/her understanding and acceptance.     Plan Discussed with: CRNA  Anesthesia Plan Comments:         Anesthesia Quick Evaluation

## 2016-09-19 NOTE — H&P (Addendum)
Obstetrics & Gynecology History and Physical Note  Date of Consultation: 09/19/2016   Requesting Provider: [facility]  Primary OBGYN: [facility] Primary Care Provider: [facility]  Reason for Consultation: Lower abdominal Pain, First trimester  History of Present Illness: April Mckee is a 26 y.o. G1P0 (Patient's last menstrual period was 07/02/2016 (approximate).), with the above CC. She has had PNC at [facility] without difficulty to date.  This am noted lower abdominal pains.  No bleeding.  Pain is sharp and shooting especially on right side, 9/10 intensity, no radiation, positioning makes it better and worse, no other modifiers, no associated findings, context of pregnancy. No other prior medical conditions.  ROS: A review of systems was performed and negative, except as stated in the above HPI.  OBGYN History: As per HPI. OB History    Gravida Para Term Preterm AB Living   1             SAB TAB Ectopic Multiple Live Births                   Past Medical History: History reviewed. No pertinent past medical history.  Past Surgical History: Past Surgical History:  Procedure Laterality Date  . NO PAST SURGERIES      Family History:  History reviewed. No pertinent family history. She denies any female cancers, bleeding or blood clotting disorders.   Social History:  Social History   Social History  . Marital status: Single    Spouse name: N/A  . Number of children: N/A  . Years of education: N/A   Occupational History  . Not on file.   Social History Main Topics  . Smoking status: Never Smoker  . Smokeless tobacco: Never Used  . Alcohol use No  . Drug use: No  . Sexual activity: Not on file   Other Topics Concern  . Not on file   Social History Narrative  . No narrative on file    Allergy: No Known Allergies  Current Outpatient Medications:  (Not in a hospital admission)  Hospital Medications: Current Facility-Administered Medications   Medication Dose Route Frequency Provider Last Rate Last Dose  . 0.9 %  sodium chloride infusion  10 mL/hr Intravenous Once Jene Every, MD       Current Outpatient Prescriptions  Medication Sig Dispense Refill  . Prenatal Vit-DSS-Fe Cbn-FA (PRENATAL AD PO) Take 1 tablet by mouth daily.    Marland Kitchen PRESCRIPTION MEDICATION Take 1 capsule by mouth 3 (three) times daily. Prescribed antibiotic    . ibuprofen (ADVIL,MOTRIN) 800 MG tablet Take 1 tablet (800 mg total) by mouth every 8 (eight) hours as needed. (Patient not taking: Reported on 09/19/2016) 30 tablet 0  . naproxen (NAPROSYN) 500 MG tablet Take 1 tablet (500 mg total) by mouth 2 (two) times daily with a meal. (Patient not taking: Reported on 09/19/2016) 20 tablet 00  . tiZANidine (ZANAFLEX) 4 MG tablet Take 1 tablet (4 mg total) by mouth every 8 (eight) hours as needed for muscle spasms. (Patient not taking: Reported on 09/19/2016) 30 tablet 0  . traMADol (ULTRAM) 50 MG tablet 1-2 tabs po q 6 hr prn pain Maximum dose= 8 tablets per day (Patient not taking: Reported on 09/19/2016) 20 tablet 0    Physical Exam: Vitals:   09/19/16 1310 09/19/16 1339 09/19/16 1347 09/19/16 1350  BP: (!) 110/97 104/75 109/61 107/63  Pulse: 81 71 64 67  Resp: (!) 24  (!) 26 (!) 25  Temp:  TempSrc:      SpO2: 100% 100% 100% 100%  Weight:      Height:        Temp:  [97.5 F (36.4 C)] 97.5 F (36.4 C) (04/20 1212) Pulse Rate:  [64-86] 67 (04/20 1350) Resp:  [15-27] 25 (04/20 1350) BP: (80-112)/(47-97) 107/63 (04/20 1350) SpO2:  [100 %] 100 % (04/20 1350) Weight:  [162 lb (73.5 kg)] 162 lb (73.5 kg) (04/20 1214) No intake/output data recorded. Total I/O In: 2000 [IV Piggyback:2000] Out: -   Intake/Output Summary (Last 24 hours) at 09/19/16 1402 Last data filed at 09/19/16 1316  Gross per 24 hour  Intake             2000 ml  Output                0 ml  Net             2000 ml    Body mass index is 30.61 kg/m. Constitutional: Well nourished,  well developed female in no acute distress.  HEENT: normal Neck:  Supple, normal appearance, and no thyromegaly  Cardiovascular:Regular rate and rhythm.  No murmurs, rubs or gallops. Respiratory:  Clear to auscultation bilateral. Normal respiratory effort Abdomen: positive bowel sounds and no masses, hernias; diffusely non tender to palpation, non distended Neuro: grossly intact Psych:  Normal mood and affect.  Skin:  Warm and dry.  MS: normal gait and normal bilateral lower extremity strength/ROM/symmetry Lymphatic:  No inguinal lymphadenopathy.   Pelvic exam: is not limited by body habitus EGBUS: within normal limits Vagina: within normal limits. Bladder and Urethra: normal. Cervix: Normal Uterus:  normal size, contour, position, consistency, mobility, non-tender Adnexa: not evaluated  Laboratory: Beta HCG: 20,000  Recent Labs Lab 09/19/16 1216  WBC 7.5  HGB 12.3  HCT 35.7  PLT 323    Recent Labs Lab 09/19/16 1227  NA 136  K 3.0*  CL 105  CO2 22  BUN 8  CREATININE 0.71  CALCIUM 8.9  GLUCOSE 137*   Recent Labs Lab 09/19/16 1228  ABORH B POS   Imaging:  Ultrasound independently reviewed/interpreted by self.    No IUP    Evidence for right sided tubal mass with fetal pole 7 weeks size measured.    FF in pelvis and abdomen up to liver edge.  Assessment: April Mckee is a 26 y.o. G1P0 (Patient's last menstrual period was 07/02/2016 (approximate).) who presented to the ED with complaints of pain; findings are consistent with right ectopic pregnancy. Urgent nature to surgery due to hemodynamic instability and severe pain.  Plan: Surgery as best option discussed.  Will try to save tube if possible.  Chances of normal fertility if contralateral tube is normal counseled.  I have had a careful discussion with this patient about all the options available and the risk/benefits of each. I have fully informed this patient that surgery may subject her to a variety of  discomforts and risks: She understands that most patients have surgery with little difficulty, but problems can happen ranging from minor to fatal. These include nausea, vomiting, pain, bleeding, infection, poor healing, hernia, or formation of adhesions. Unexpected reactions may occur from any drug or anesthetic given. Unintended injury may occur to other pelvic or abdominal structures such as Fallopian tubes, ovaries, bladder, ureter (tube from kidney to bladder), or bowel. Nerves going from the pelvis to the legs may be injured. Any such injury may require immediate or later additional surgery to correct the problem.  Excessive blood loss requiring transfusion is very unlikely but possible. Dangerous blood clots may form in the legs or lungs. Physical and sexual activity will be restricted in varying degrees for an indeterminate period of time but most often 2-6 weeks.  Finally, she understands that it is impossible to list every possible undesirable effect and that the condition for which surgery is done is not always cured or significantly improved, and in rare cases may be even worse.Ample time was given to answer all questions.  Pt counseled on the possible need for blood transfusion if has further drop in hemoglobin and hemodynamic status.  Pt expresses she is against any blood transfusion based on religion, and signs form expressing desire for no blood transfusion even if life threatening, even with risk of death.  Annamarie Major, MD [facility] Pager [phone]

## 2016-09-19 NOTE — Transfer of Care (Signed)
Immediate Anesthesia Transfer of Care Note  Patient: April Mckee  Procedure(s) Performed: Procedure(s): DIAGNOSTIC LAPAROSCOPY WITH REMOVAL OF ECTOPIC PREGNANCY (Right) LAPAROSCOPIC RIGHT SALPINGECTOMY (Right)  Patient Location: PACU  Anesthesia Type:General  Level of Consciousness: awake  Airway & Oxygen Therapy: Patient Spontanous Breathing and Patient connected to face mask oxygen  Post-op Assessment: Report given to RN and Post -op Vital signs reviewed and stable  Post vital signs: Reviewed  Last Vitals:  Vitals:   09/19/16 1427 09/19/16 1624  BP: (!) 90/55 121/69  Pulse: 67 (!) 115  Resp: 18 14  Temp: 37.2 C     Last Pain:  Vitals:   09/19/16 1427  TempSrc: Tympanic  PainSc: 9       Patients Stated Pain Goal: 2 (09/19/16 1251)  Complications: No apparent anesthesia complications

## 2016-09-19 NOTE — ED Notes (Signed)
US exam complete

## 2016-09-19 NOTE — ED Notes (Signed)
US at bedside for exam.

## 2016-09-19 NOTE — Anesthesia Post-op Follow-up Note (Cosign Needed)
Anesthesia QCDR form completed.        

## 2016-09-19 NOTE — ED Notes (Signed)
Patient c/o worsening pain.  Dr. Cyril Loosen alerted, no additional orders given.  Will continue to monitor patient's vital signs closely.

## 2016-09-19 NOTE — ED Provider Notes (Signed)
[facility] Emergency Department Provider Note   ____________________________________________    I have reviewed the triage vital signs and the nursing notes.   HISTORY  Chief Complaint Abdominal Cramping     HPI April Mckee is a 26 y.o. female who presents with complaints of severe lower abdominal/pelvic pain primarily on the left. This pain started approximately 30-40 minutes ago. Patient reports she is pregnant, this is her first pregnancy. LMP January 21. Positive pregnancy test confirmed by health department. No fevers or chills. No nausea or vomiting. No vaginal bleeding. No vaginal discharge.   History reviewed. No pertinent past medical history.  There are no active problems to display for this patient.   Past Surgical History:  Procedure Laterality Date  . NO PAST SURGERIES      Prior to Admission medications   Medication Sig Start Date End Date Taking? Authorizing Provider  ibuprofen (ADVIL,MOTRIN) 800 MG tablet Take 1 tablet (800 mg total) by mouth every 8 (eight) hours as needed. 05/06/16   Domenick Gong, MD  methocarbamol (ROBAXIN-750) 750 MG tablet Take 1 tablet (750 mg total) by mouth 4 (four) times daily. 05/22/16   Joni Reining, PA-C  naproxen (NAPROSYN) 500 MG tablet Take 1 tablet (500 mg total) by mouth 2 (two) times daily with a meal. 05/22/16   Joni Reining, PA-C  tiZANidine (ZANAFLEX) 4 MG tablet Take 1 tablet (4 mg total) by mouth every 8 (eight) hours as needed for muscle spasms. 05/06/16   Domenick Gong, MD  traMADol (ULTRAM) 50 MG tablet 1-2 tabs po q 6 hr prn pain Maximum dose= 8 tablets per day 05/06/16   Domenick Gong, MD     Allergies Patient has no known allergies.  History reviewed. No pertinent family history.  Social History Social History  Substance Use Topics  . Smoking status: Never Smoker  . Smokeless tobacco: Never Used  . Alcohol use No    Review of Systems  Constitutional: No  fever/chills   Cardiovascular: Denies chest pain. Respiratory: Denies shortness of breath. Gastrointestinal: As above Genitourinary: No vaginal bleeding Musculoskeletal: Negative for back pain.  Neurological: Negative for headaches  10-point ROS otherwise negative.  ____________________________________________   PHYSICAL EXAM:  VITAL SIGNS: ED Triage Vitals  Enc Vitals Group     BP 09/19/16 1212 (!) 85/57     Pulse Rate 09/19/16 1212 71     Resp 09/19/16 1212 18     Temp 09/19/16 1212 97.5 F (36.4 C)     Temp Source 09/19/16 1212 Oral     SpO2 09/19/16 1212 100 %     Weight 09/19/16 1214 162 lb (73.5 kg)     Height 09/19/16 1214  (1.549 m)     Head Circumference --      Peak Flow --      Pain Score 09/19/16 1211 10     Pain Loc --      Pain Edu? --      Excl. in GC? --     Constitutional: Alert and oriented. Ill-appearing Eyes: Conjunctivae are normal.   Nose: No congestion/rhinnorhea. Mouth/Throat: Mucous membranes are moist.    Cardiovascular: Normal rate, regular rhythm. Grossly normal heart sounds.  Good peripheral circulation. Respiratory: Normal respiratory effort.  No retractions. Lungs CTAB. Gastrointestinal: Significant tenderness to palpation left lower quadrant. Also some tenderness in the right lower quadrant No distention.  No CVA tenderness. Genitourinary: deferred Musculoskeletal: Warm and well perfused Neurologic:  Normal speech and  language. No gross focal neurologic deficits are appreciated.  Skin:  Skin is warm, dry and intact.  Psychiatric: Mood and affect are normal. Speech and behavior are normal.  ____________________________________________   LABS (all labs ordered are listed, but only abnormal results are displayed)  Labs Reviewed  CBC  BASIC METABOLIC PANEL  HCG, QUANTITATIVE, PREGNANCY  TYPE AND SCREEN  ABO/RH  PREPARE RBC (CROSSMATCH)    ____________________________________________  EKG  None ____________________________________________  RADIOLOGY  Stat ultrasound pending ____________________________________________   PROCEDURES  Procedure(s) performed: No    Critical Care performed: yes  CRITICAL CARE Performed by: Jene Every   Total critical care time: 40 minutes  Critical care time was exclusive of separately billable procedures and treating other patients.  Critical care was necessary to treat or prevent imminent or life-threatening deterioration.  Critical care was time spent personally by me on the following activities: development of treatment plan with patient and/or surrogate as well as nursing, discussions with consultants, evaluation of patient's response to treatment, examination of patient, obtaining history from patient or surrogate, ordering and performing treatments and interventions, ordering and review of laboratory studies, ordering and review of radiographic studies, pulse oximetry and re-evaluation of patient's condition.  ____________________________________________   INITIAL IMPRESSION / ASSESSMENT AND PLAN / ED COURSE  Pertinent labs & imaging results that were available during my care of the patient were reviewed by me and considered in my medical decision making (see chart for details).  Patient presents with abrupt onset severe left lower quadrant pain. High suspicion for ectopic pregnancy. Patient was hypotensive upon arrival. Fast ultrasound concerning for free fluid in the abdomen. Stat page to gynecology ----------------------------------------- 12:57 PM on 09/19/2016 -----------------------------------------  Patient seen by gynecologist in the emergency department. Blood pressure is improved, he recommends ultrasound, discussed with ultrasound department and they will take the patient immediately  ----------------------------------------- 1:12 PM on  09/19/2016 -----------------------------------------  BP remains stable. 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 I'm concerned the patient is bleeding internally but she refuses.  ----------------------------------------- 1:45 PM on 09/19/2016 -----------------------------------------  Discussed with Dr. Tiburcio Pea, positive ectopic on ultrasound, he will take to the OR    ____________________________________________   FINAL CLINICAL IMPRESSION(S) / ED DIAGNOSES  Final diagnoses:  Right tubal pregnancy without intrauterine pregnancy      NEW MEDICATIONS STARTED DURING THIS VISIT:  New Prescriptions   No medications on file     Note:  This document was prepared using Dragon voice recognition software and may include unintentional dictation errors.    Jene Every, MD 09/19/16 [phone]

## 2016-09-20 ENCOUNTER — Encounter: Payer: Self-pay | Admitting: Obstetrics & Gynecology

## 2016-09-20 DIAGNOSIS — Z8759 Personal history of other complications of pregnancy, childbirth and the puerperium: Secondary | ICD-10-CM

## 2016-09-20 LAB — CBC
HCT: 22.5 % — ABNORMAL LOW (ref 35.0–47.0)
HEMOGLOBIN: 7.8 g/dL — AB (ref 12.0–16.0)
MCH: 31.1 pg (ref 26.0–34.0)
MCHC: 34.8 g/dL (ref 32.0–36.0)
MCV: 89.4 fL (ref 80.0–100.0)
PLATELETS: 196 10*3/uL (ref 150–440)
RBC: 2.51 MIL/uL — ABNORMAL LOW (ref 3.80–5.20)
RDW: 12.4 % (ref 11.5–14.5)
WBC: 8.5 10*3/uL (ref 3.6–11.0)

## 2016-09-20 MED ORDER — OXYCODONE-ACETAMINOPHEN 5-325 MG PO TABS: 1.0000 | ORAL_TABLET | Freq: Four times a day (QID) | ORAL | 0 refills | Status: DC | PRN

## 2016-09-20 MED ORDER — IBUPROFEN 600 MG PO TABS: 600.0000 mg | ORAL_TABLET | Freq: Four times a day (QID) | ORAL | 0 refills | Status: DC | PRN

## 2016-09-20 MED ORDER — FERROUS SULFATE 325 (65 FE) MG PO TABS: 325.0000 mg | ORAL_TABLET | Freq: Every day | ORAL | 2 refills | Status: DC

## 2016-09-20 NOTE — Discharge Summary (Signed)
Gynecology Discharge Summary Date of Admission: 09/19/2016 Date of Discharge: 09/20/2016  The patient was admitted, as scheduled, and underwent a laparoscopic right salpingectomy; please refer to operative note for full details.  She was meeting all post op goals and discharged to home on POD#1. Pt has been ambulating and voiding without difficulty. She is tolerating PO intake and her pain is controlled with PO medications. Her incisions are covered with gauze dressings and are C/D/I. Postpartum care instructions reviewed with patient.  Allergies as of 09/20/2016   No Known Allergies     Medication List    STOP taking these medications   naproxen 500 MG tablet Commonly known as:  NAPROSYN   PRENATAL AD PO   PRESCRIPTION MEDICATION   tiZANidine 4 MG tablet Commonly known as:  ZANAFLEX   traMADol 50 MG tablet Commonly known as:  ULTRAM     TAKE these medications   ferrous sulfate 325 (65 FE) MG tablet Commonly known as:  FERROUSUL Take 1 tablet (325 mg total) by mouth daily with breakfast.   ibuprofen 600 MG tablet Commonly known as:  ADVIL,MOTRIN Take 1 tablet (600 mg total) by mouth every 6 (six) hours as needed for moderate pain. What changed:  medication strength  how much to take  when to take this  reasons to take this   oxyCODONE-acetaminophen 5-325 MG tablet Commonly known as:  PERCOCET/ROXICET Take 1-2 tablets by mouth every 6 (six) hours as needed for severe pain (moderate to severe pain (when tolerating fluids)).       Future Appointments In 2 weeks for surgery follow up and birth control conference with Dr Tiburcio Pea at [facility] [address] [place], [place] [address]   Tresea Mall, CNM

## 2016-09-20 NOTE — Progress Notes (Signed)
Discharge instructions complete and prescriptions given. Patient verbalizes understanding of teaching. Patient discharged home at 11:25. 

## 2016-09-20 NOTE — Discharge Instructions (Signed)
Ectopic Pregnancy °An ectopic pregnancy is when the fertilized egg attaches (implants) outside the uterus. Most ectopic pregnancies occur in one of the tubes where eggs travel from the ovary to the uterus (fallopian tubes), but the implanting can occur in other locations. In rare cases, ectopic pregnancies occur on the ovary, intestine, pelvis, abdomen, or cervix. In an ectopic pregnancy, the fertilized egg does not have the ability to develop into a normal, healthy baby. °A ruptured ectopic pregnancy is one in which tearing or bursting of a fallopian tube causes internal bleeding. Often, there is intense lower abdominal pain, and vaginal bleeding sometimes occurs. Having an ectopic pregnancy can be life-threatening. If this dangerous condition is not treated, it can lead to blood loss, shock, or even death. °What are the causes? °The most common cause of this condition is damage to one of the fallopian tubes. A fallopian tube may be narrowed or blocked, and that keeps the fertilized egg from reaching the uterus. °What increases the risk? °This condition is more likely to develop in women of childbearing age who have different levels of risk. The levels of risk can be divided into three categories. °High risk  °· You have gone through infertility treatment. °· You have had an ectopic pregnancy before. °· You have had surgery on the fallopian tubes, or another surgical procedure, such as an abortion. °· You have had surgery to have the fallopian tubes tied (tubal ligation). °· You have problems or diseases of the fallopian tubes. °· You have been exposed to diethylstilbestrol (DES). This medicine was used until 1971, and it had effects on babies whose mothers took the medicine. °· You become pregnant while using an IUD (intrauterine device) for birth control. °Moderate risk  °· You have a history of infertility. °· You have had an STI (sexually transmitted infection). °· You have a history of pelvic inflammatory  disease (PID). °· You have scarring from endometriosis. °· You have multiple sexual partners. °· You smoke. °Low risk  °· You have had pelvic surgery. °· You use vaginal douches. °· You became sexually active before age 18. °What are the signs or symptoms? °Common symptoms of this condition include normal pregnancy symptoms, such as missing a period, nausea, tiredness, abdominal pain, breast tenderness, and bleeding. However, ectopic pregnancy will have additional symptoms, such as: °· Pain with intercourse. °· Irregular vaginal bleeding or spotting. °· Cramping or pain on one side or in the lower abdomen. °· Fast heartbeat, low blood pressure, and sweating. °· Passing out while having a bowel movement. °Symptoms of a ruptured ectopic pregnancy and internal bleeding may include: °· Sudden, severe pain in the abdomen and pelvis. °· Dizziness, weakness, light-headedness, or fainting. °· Pain in the shoulder or neck area. °How is this diagnosed? °This condition is diagnosed by: °· A pelvic exam to locate pain or a mass in the abdomen. °· A pregnancy test. This blood test checks for the presence as well as the specific level of pregnancy hormone in the bloodstream. °· Ultrasound. This is performed if a pregnancy test is positive. In this test, a probe is inserted into the vagina. The probe will detect a fetus, possibly in a location other than the uterus. °· Taking a sample of uterus tissue (dilation and curettage, or D&C). °· Surgery to perform a visual exam of the inside of the abdomen using a thin, lighted tube that has a tiny camera on the end (laparoscope). °· Culdocentesis. This procedure involves inserting a needle at the   top of the vagina, behind the uterus. If blood is present in this area, it may indicate that a fallopian tube is torn. °How is this treated? °This condition is treated with medicine or surgery. °Medicine  °· An injection of a medicine (methotrexate) may be given to cause the pregnancy tissue to  be absorbed. This medicine may save your fallopian tube. It may be given if: °¨ The diagnosis is made early, with no signs of active bleeding. °¨ The fallopian tube has not ruptured. °¨ You are considered to be a good candidate for the medicine. °Usually, pregnancy hormone blood levels are checked after methotrexate treatment. This is to be sure that the medicine is effective. It may take 4-6 weeks for the pregnancy to be absorbed. Most pregnancies will be absorbed by 3 weeks. °Surgery  °· A laparoscope may be used to remove the pregnancy tissue. °· If severe internal bleeding occurs, a larger cut (incision) may be made in the lower abdomen (laparotomy) to remove the fetus and placenta. This is done to stop the bleeding. °· Part or all of the fallopian tube may be removed (salpingectomy) along with the fetus and placenta. The fallopian tube may also be repaired during the surgery. °· In very rare circumstances, removal of the uterus (hysterectomy) may be required. °· After surgery, pregnancy hormone testing may be done to be sure that there is no pregnancy tissue left. °Whether your treatment is medicine or surgery, you may receive a Rho (D) immune globulin shot to prevent problems with any future pregnancy. This shot may be given if: °· You are Rh-negative and the baby's father is Rh-positive. °· You are Rh-negative and you do not know the Rh type of the baby's father. °Follow these instructions at home: °· Rest and limit your activity after the procedure for as long as told by your health care provider. °· Until your health care provider says that it is safe: °¨ Do not lift anything that is heavier than 10 lb (4.5 kg), or the limit that your health care provider tells you. °¨ Avoid physical exercise and any movement that requires effort (is strenuous). °· To help prevent constipation: °¨ Eat a healthy diet that includes fruits, vegetables, and whole grains. °¨ Drink 6-8 glasses of water per day. °Get help right  away if: °· You develop worsening pain that is not relieved by medicine. °· You have: °¨ A fever or chills. °¨ Vaginal bleeding. °¨ Redness and swelling at the incision site. °¨ Nausea and vomiting. °· You feel dizzy or weak. °· You feel light-headed or you faint. °This information is not intended to replace advice given to you by your health care provider. Make sure you discuss any questions you have with your health care provider. °Document Released: 06/26/2004 Document Revised: 01/16/2016 Document Reviewed: 12/19/2015 °Elsevier Interactive Patient Education © 2017 [employer] ° °

## 2016-09-22 ENCOUNTER — Ambulatory Visit
Admission: RE | Admit: 2016-09-22 | Discharge: 2016-09-22 | Disposition: A | Discharge status: Home / Self Care | Payer: Self-pay | Source: Ambulatory Visit | Attending: Nurse Practitioner | Admitting: Nurse Practitioner

## 2016-09-23 LAB — SURGICAL PATHOLOGY

## 2017-07-21 ENCOUNTER — Ambulatory Visit (INDEPENDENT_AMBULATORY_CARE_PROVIDER_SITE_OTHER): Payer: Self-pay

## 2017-07-21 ENCOUNTER — Ambulatory Visit
Admission: EM | Admit: 2017-07-21 | Discharge: 2017-07-21 | Disposition: A | Discharge status: Home / Self Care | Payer: Self-pay | Attending: Internal Medicine | Admitting: Internal Medicine

## 2017-07-21 ENCOUNTER — Other Ambulatory Visit: Payer: Self-pay

## 2017-07-21 ENCOUNTER — Encounter: Payer: Self-pay | Admitting: *Deleted

## 2017-07-21 DIAGNOSIS — S6392XA Sprain of unspecified part of left wrist and hand, initial encounter: Secondary | ICD-10-CM
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
## 2017-07-27 ENCOUNTER — Telehealth: Payer: Self-pay | Admitting: Obstetrics & Gynecology

## 2017-07-27 NOTE — Telephone Encounter (Signed)
Calling patient to verify insurance that's not scanned in nor files. Pt is showing as self pay. Please correct when pt calls

## 2017-10-30 IMAGING — US US OB TRANSVAGINAL
1 series · 13 of 28 positions shown · non-contrast
Comparison: None.

CLINICAL DATA: Pregnant, severe abdominal pain, evaluate for
ectopic

EXAM:
OBSTETRIC <14 WK US AND TRANSVAGINAL OB US
TECHNIQUE: Both transabdominal and transvaginal ultrasound examinations were
performed for complete evaluation of the gestation as well as the
maternal uterus, adnexal regions, and pelvic cul-de-sac.
Transvaginal technique was performed to assess early pregnancy.

[Series 1: us ob transvaginal · 0.26mm/px · 13 of 83 slices shown]
[im 4/83]
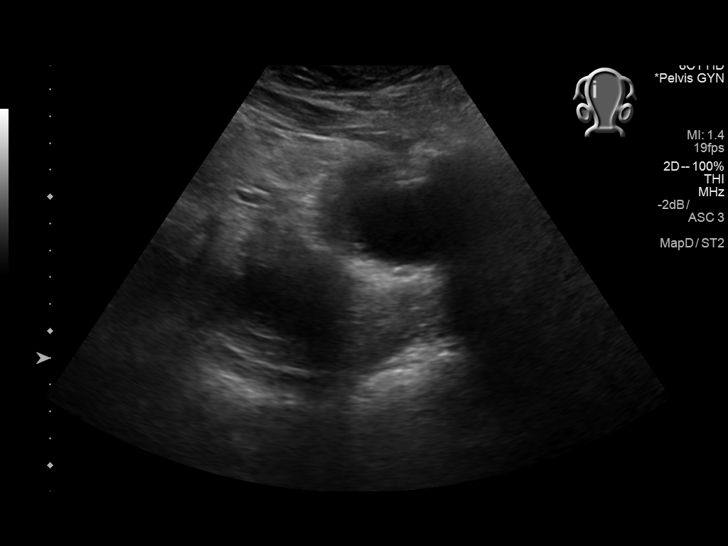
[im 10/83]
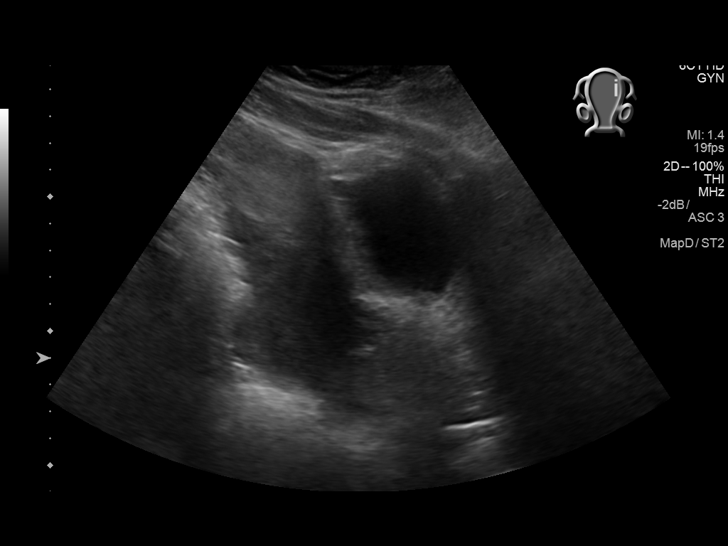
[im 16/83]
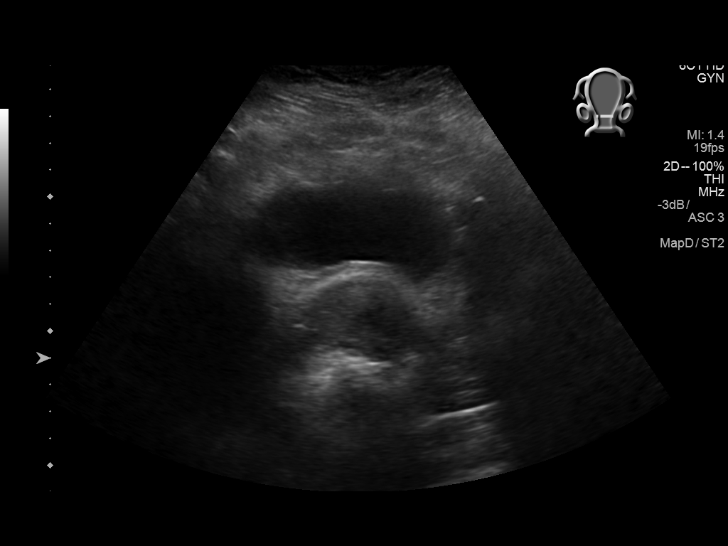
[im 22/83]
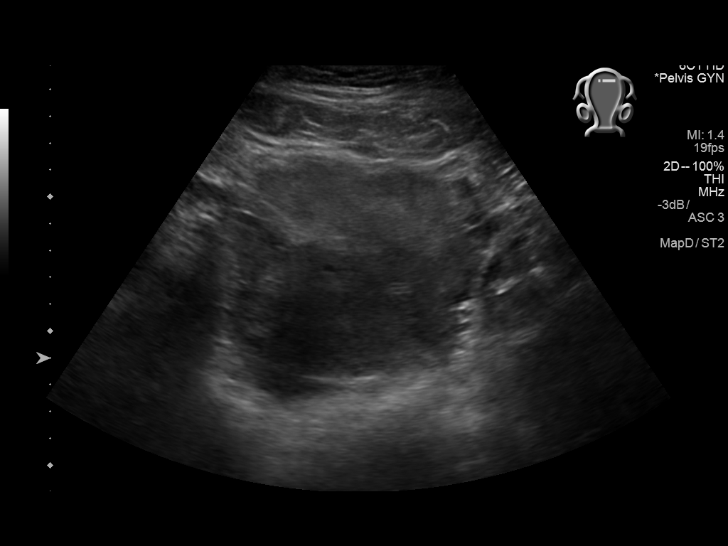
[im 28/83]
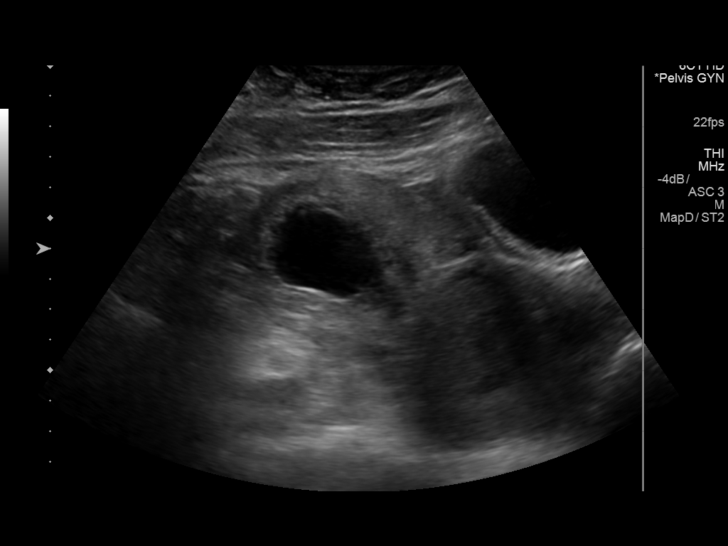
[im 34/83]
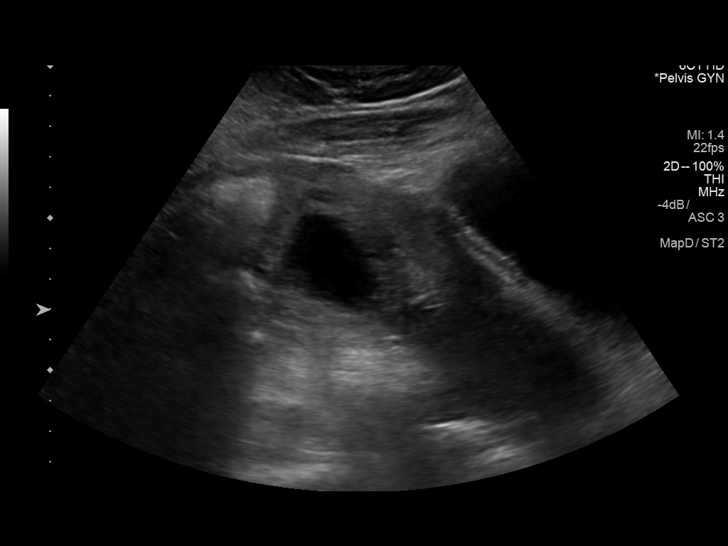
[im 43/83]
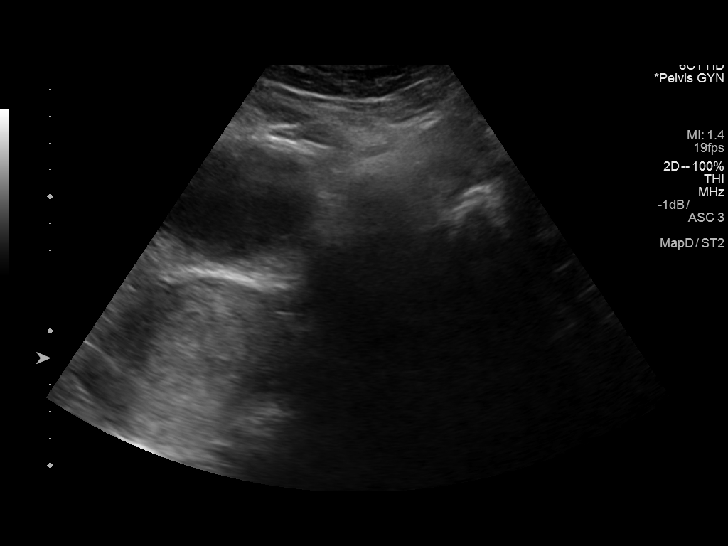
[im 49/83]
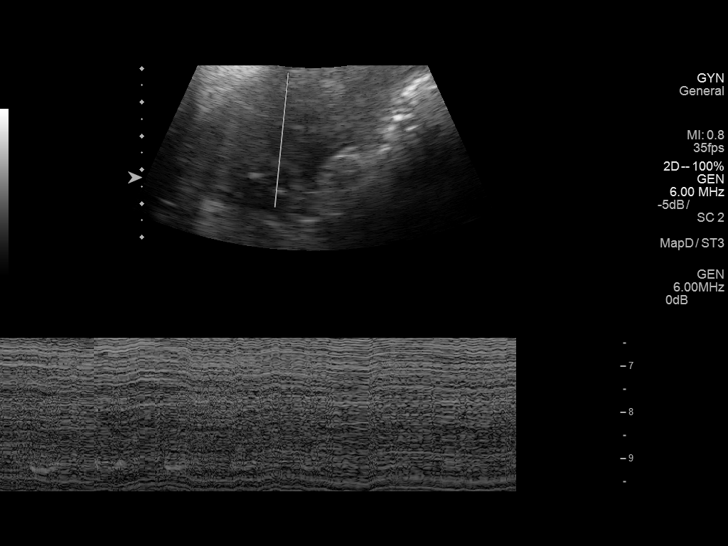
[im 55/83]
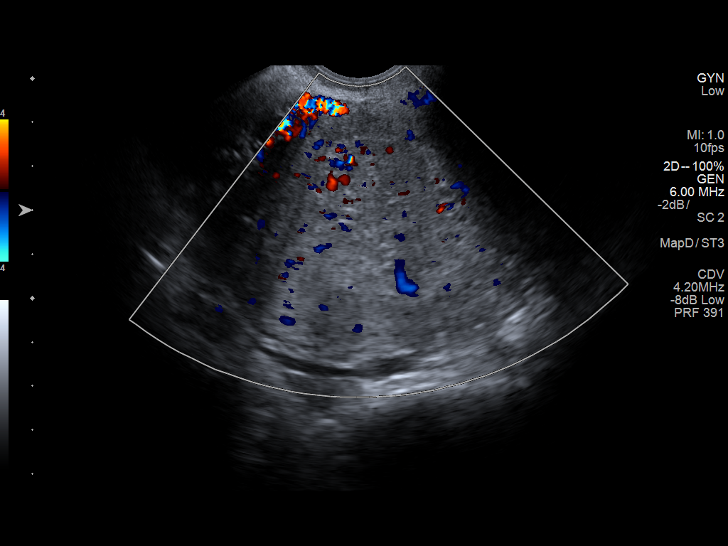
[im 61/83]
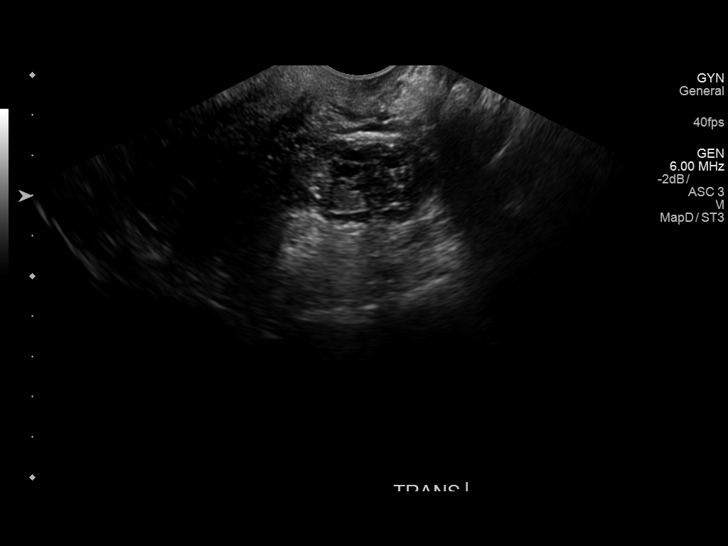
[im 67/83]
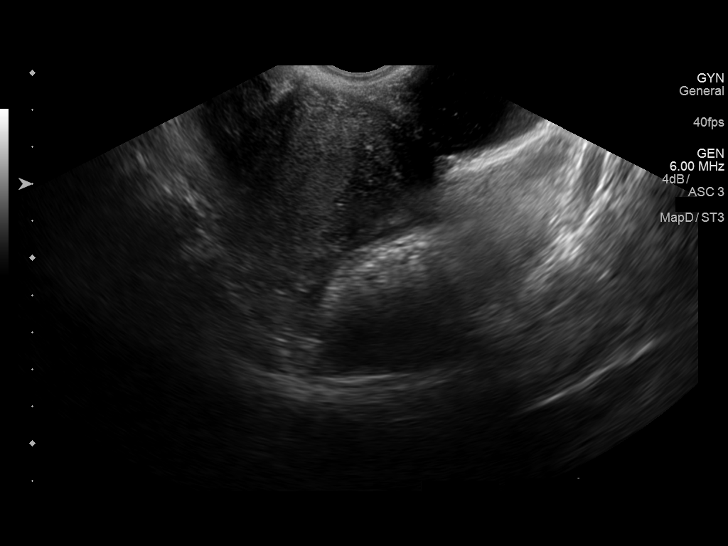
[im 73/83]
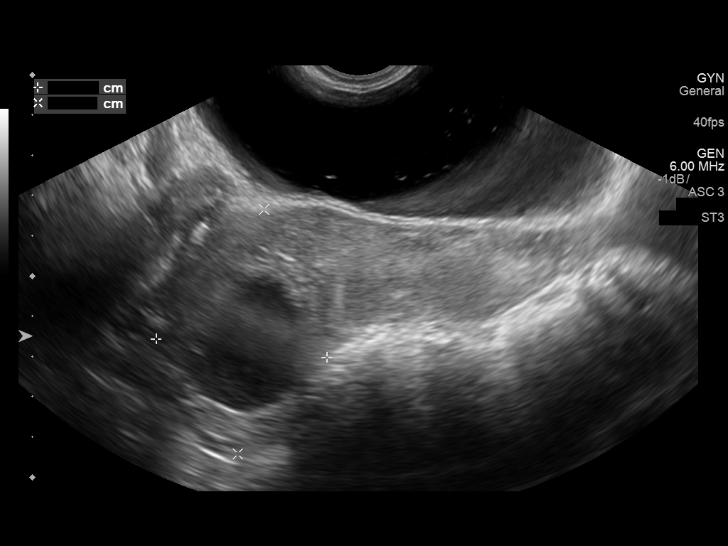
[im 79/83]
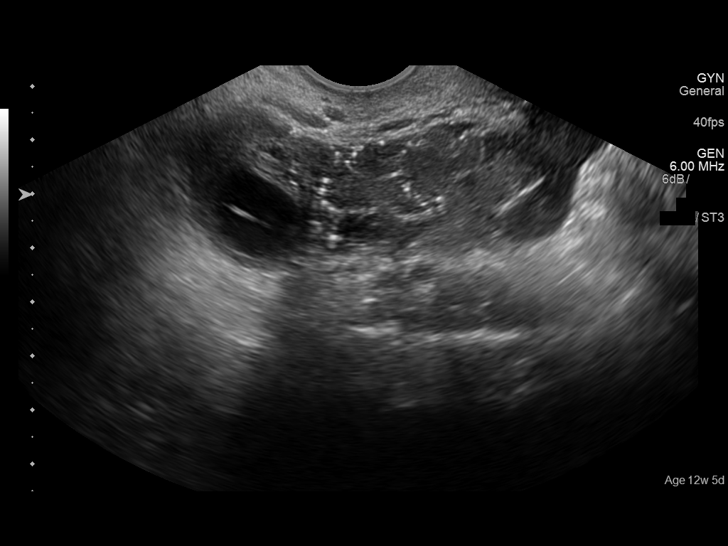

[13 of 28 positions shown; findings below may reference images not displayed]

FINDINGS: Intrauterine gestational sac: None

Yolk sac:  Not Visualized.

Embryo:  Visualized.

Cardiac Activity: Visualized.

Heart Rate: 72  bpm

CRL:  14.6  mm   7 w   1 d

Subchorionic hemorrhage:  None visualized.

Maternal uterus/adnexae: 6.0 x 5.6 x 4.6 cm right adnexal mass with
visualized fetal pole and cardiac activity, compatible with right
adnexal/tubal ectopic pregnancy.

Distinct right ovary with suspected corpus luteal cysts.

Left ovary is within normal limits.

Small volume right abdominopelvic fluid.
IMPRESSION: Live right adnexal ectopic pregnancy, as described above.

Small volume right abdominopelvic fluid.

Critical Value/emergent results were called by telephone at the time
of interpretation on 09/19/2016 at [DATE] to Dr. SEBISYAQUB MUSABEYLI, who
verbally acknowledged these results.

## 2018-01-12 ENCOUNTER — Other Ambulatory Visit: Payer: Self-pay

## 2018-01-12 ENCOUNTER — Encounter: Payer: Self-pay | Admitting: Emergency Medicine

## 2018-01-12 ENCOUNTER — Ambulatory Visit
Admission: EM | Admit: 2018-01-12 | Discharge: 2018-01-12 | Disposition: A | Discharge status: Home / Self Care | Payer: 59 | Attending: Family Medicine | Admitting: Family Medicine

## 2018-01-12 DIAGNOSIS — H6691 Otitis media, unspecified, right ear: Secondary | ICD-10-CM | POA: Diagnosis not present

## 2018-01-12 DIAGNOSIS — H60501 Unspecified acute noninfective otitis externa, right ear: Secondary | ICD-10-CM

## 2018-01-12 MED ORDER — AMOXICILLIN 875 MG PO TABS: 875.0000 mg | ORAL_TABLET | Freq: Two times a day (BID) | ORAL | 0 refills | Status: DC

## 2018-01-12 MED ORDER — CIPROFLOXACIN-DEXAMETHASONE 0.3-0.1 % OT SUSP: 4.0000 [drp] | Freq: Two times a day (BID) | OTIC | 0 refills | Status: AC

## 2018-01-12 NOTE — ED Triage Notes (Signed)
Patient c/o right ear pain that started yesterday.  Patient reports fever Monday night.

## 2018-01-12 NOTE — ED Provider Notes (Signed)
[facility] ____________________________________________  Time seen: Approximately 4:30 PM  I have reviewed the triage vital signs and the nursing notes.   HISTORY  Chief Complaint Otalgia (right)   HPI April Mckee is a 27 y.o. female presenting for evaluation of right ear pain present for last 2 days.  States felt like she may have had a fever last night as she felt very warm, did not measure.  States current ear pain feels similar to previous ear infections.  Denies any ear trauma.  States hearing to right ear does seem muffled.  Denies any accompanying cough, congestion, sore throat or other complaints.  Denies aggravating or alleviating factors.  Has been swimming a lot recently.  Reports otherwise feels well.Denies recent sickness. Denies recent antibiotic use.   [facility]: PCP Patient's last menstrual period was 12/22/2017 (exact date).  Denies pregnancy.   History reviewed. No pertinent past medical history.  Patient Active Problem List   Diagnosis Date Noted  . Status post ectopic pregnancy 09/20/2016    Past Surgical History:  Procedure Laterality Date  . DIAGNOSTIC LAPAROSCOPY WITH REMOVAL OF ECTOPIC PREGNANCY Right 09/19/2016   Procedure: DIAGNOSTIC LAPAROSCOPY WITH REMOVAL OF ECTOPIC PREGNANCY;  Surgeon: Nadara Mustardobert P Harris, MD;  Location: [facility];  Service: Gynecology;  Laterality: Right;  . LAPAROSCOPIC UNILATERAL SALPINGECTOMY Right 09/19/2016   Procedure: LAPAROSCOPIC RIGHT SALPINGECTOMY;  Surgeon: Nadara Mustardobert P Harris, MD;  Location: [facility];  Service: Gynecology;  Laterality: Right;  . NO PAST SURGERIES       No current facility-administered medications for this encounter.   Current Outpatient Medications:  .  amoxicillin (AMOXIL) 875 MG tablet, Take 1 tablet (875 mg total) by mouth 2 (two) times daily., Disp: 20 tablet, Rfl: 0 .  ciprofloxacin-dexamethasone (CIPRODEX) OTIC suspension, Place 4 drops into the right ear 2 (two)  times daily for 7 days., Disp: 7.5 mL, Rfl: 0  Allergies Patient has no known allergies.  Family History  Problem Relation Age of Onset  . Healthy Mother     Social History Social History   Tobacco Use  . Smoking status: Never Smoker  . Smokeless tobacco: Never Used  Substance Use Topics  . Alcohol use: No  . Drug use: No    Review of Systems Constitutional: As above. ENT: No sore throat.  As above Cardiovascular: Denies chest pain. Respiratory: Denies shortness of breath. Gastrointestinal: No abdominal pain.   Musculoskeletal: Negative for back pain. Skin: Negative for rash.   ____________________________________________   PHYSICAL EXAM:  VITAL SIGNS: ED Triage Vitals  Enc Vitals Group     BP 01/12/18 1605 98/71     Pulse Rate 01/12/18 1605 87     Resp 01/12/18 1605 14     Temp 01/12/18 1605 99 F (37.2 C)     Temp Source 01/12/18 1605 Oral     SpO2 01/12/18 1605 100 %     Weight 01/12/18 1602 168 lb (76.2 kg)     Height 01/12/18 1602 5\' 1"  (1.549 m)     Head Circumference --      Peak Flow --      Pain Score 01/12/18 1602 10     Pain Loc --      Pain Edu? --      Excl. in GC? --     Constitutional: Alert and oriented. Well appearing and in no acute distress. Eyes: Conjunctivae are normal. PERRL. EOMI. Head: Atraumatic. No sinus tenderness to palpation. No swelling. No erythema.  Ears:  Left: Nontender, normal canal, no erythema, normal TM.  Right: Tenderness with auricle movement, edematous canal with mild erythema and mild to moderate drainage present in the canal, unable to fully visualize TM, but TM does appear erythematous, TM appears intact.  No mastoid tenderness bilaterally.  Nose:No nasal congestion   Mouth/Throat: Mucous membranes are moist. No pharyngeal erythema. No tonsillar swelling or exudate.  Neck: No stridor.  No cervical spine tenderness to palpation. Hematological/Lymphatic/Immunilogical: Anterior right cervical  lymphadenopathy. Cardiovascular: Normal rate, regular rhythm. Grossly normal heart sounds.  Good peripheral circulation. Respiratory: Normal respiratory effort.  No retractions. No wheezes, rales or rhonchi. Good air movement.  Musculoskeletal: Ambulatory with steady gait. Neurologic:  Normal speech and language. No gait instability. Skin:  Skin appears warm, dry and intact. No rash noted. Psychiatric: Mood and affect are normal. Speech and behavior are normal. ___________________________________________   LABS (all labs ordered are listed, but only abnormal results are displayed)  Labs Reviewed - No data to display  PROCEDURES Procedures   Residual cleaned were then obtained.  Curette utilized and removed some exudate from right ear canal to better visualize TM.  Patient tolerated well.  INITIAL IMPRESSION / ASSESSMENT AND PLAN / ED COURSE  Pertinent labs & imaging results that were available during my care of the patient were reviewed by me and considered in my medical decision making (see chart for details).  Overall well-appearing patient.  No acute distress.  Right otitis externa, unable to fully visualize the right TM, but TM does appear intact however erythematous.  Will treat patient with oral amoxicillin and Ciprodex otic drops.  Encourage over-the-counter Tylenol and ibuprofen as needed.  Supportive care and keeping ear dry.  Discussed strict follow-up and return parameters.Discussed indication, risks and benefits of medications with patient.  Discussed follow up with Primary care physician this week. Discussed follow up and return parameters including no resolution or any worsening concerns. Patient verbalized understanding and agreed to plan.   ____________________________________________   FINAL CLINICAL IMPRESSION(S) / ED DIAGNOSES  Final diagnoses:  Right otitis media, unspecified otitis media type  Acute otitis externa of right ear, unspecified type     ED  Discharge Orders         Ordered    ciprofloxacin-dexamethasone (CIPRODEX) OTIC suspension  2 times daily     01/12/18 1617    amoxicillin (AMOXIL) 875 MG tablet  2 times daily     01/12/18 1617           Note: This dictation was prepared with Dragon dictation along with smaller phrase technology. Any transcriptional errors that result from this process are unintentional.         Renford DillsMiller, Keana Dueitt, NP 01/12/18 [phone]

## 2018-01-12 NOTE — Discharge Instructions (Addendum)
Take medication as prescribed. Rest. Drink plenty of fluids.  ° °Follow up with your primary care physician this week as needed. Return to Urgent care for new or worsening concerns.  ° °

## 2021-01-20 ENCOUNTER — Emergency Department
Admission: EM | Admit: 2021-01-20 | Discharge: 2021-01-21 | Disposition: A | Discharge status: Home / Self Care | Payer: Self-pay | Attending: Emergency Medicine | Admitting: Emergency Medicine

## 2021-01-20 ENCOUNTER — Emergency Department: Payer: Self-pay

## 2021-01-20 ENCOUNTER — Other Ambulatory Visit: Payer: Self-pay

## 2021-01-20 DIAGNOSIS — R1011 Right upper quadrant pain: Secondary | ICD-10-CM

## 2021-01-20 DIAGNOSIS — N12 Tubulo-interstitial nephritis, not specified as acute or chronic: Secondary | ICD-10-CM | POA: Insufficient documentation

## 2021-01-20 LAB — COMPREHENSIVE METABOLIC PANEL
ALT: 28 U/L (ref 0–44)
AST: 32 U/L (ref 15–41)
Albumin: 3.5 g/dL (ref 3.5–5.0)
Alkaline Phosphatase: 88 U/L (ref 38–126)
Anion gap: 8 (ref 5–15)
BUN: 21 mg/dL — ABNORMAL HIGH (ref 6–20)
CO2: 23 mmol/L (ref 22–32)
Calcium: 8.5 mg/dL — ABNORMAL LOW (ref 8.9–10.3)
Chloride: 99 mmol/L (ref 98–111)
Creatinine, Ser: 1.26 mg/dL — ABNORMAL HIGH (ref 0.44–1.00)
GFR, Estimated: 58 mL/min — ABNORMAL LOW (ref >60)
Glucose, Bld: 153 mg/dL — ABNORMAL HIGH (ref 70–99)
Potassium: 3.7 mmol/L (ref 3.5–5.1)
Sodium: 130 mmol/L — ABNORMAL LOW (ref 135–145)
Total Bilirubin: 0.6 mg/dL (ref 0.3–1.2)
Total Protein: 7.7 g/dL (ref 6.5–8.1)

## 2021-01-20 LAB — CBC WITH DIFFERENTIAL/PLATELET
Abs Immature Granulocytes: 0.11 10*3/uL — ABNORMAL HIGH (ref 0.00–0.07)
Basophils Absolute: 0.1 10*3/uL (ref 0.0–0.1)
Basophils Relative: 0 %
Eosinophils Absolute: 0 10*3/uL (ref 0.0–0.5)
Eosinophils Relative: 0 %
HCT: 35.1 % — ABNORMAL LOW (ref 36.0–46.0)
Hemoglobin: 11.9 g/dL — ABNORMAL LOW (ref 12.0–15.0)
Immature Granulocytes: 1 %
Lymphocytes Relative: 3 %
Lymphs Abs: 0.4 10*3/uL — ABNORMAL LOW (ref 0.7–4.0)
MCH: 32.3 pg (ref 26.0–34.0)
MCHC: 33.9 g/dL (ref 30.0–36.0)
MCV: 95.4 fL (ref 80.0–100.0)
Monocytes Absolute: 1.2 10*3/uL — ABNORMAL HIGH (ref 0.1–1.0)
Monocytes Relative: 8 %
Neutro Abs: 12.5 10*3/uL — ABNORMAL HIGH (ref 1.7–7.7)
Neutrophils Relative %: 88 %
Platelets: 226 10*3/uL (ref 150–400)
RBC: 3.68 MIL/uL — ABNORMAL LOW (ref 3.87–5.11)
RDW: 11.9 % (ref 11.5–15.5)
WBC: 14.3 10*3/uL — ABNORMAL HIGH (ref 4.0–10.5)
nRBC: 0 % (ref 0.0–0.2)

## 2021-01-20 LAB — URINALYSIS, COMPLETE (UACMP) WITH MICROSCOPIC
Bilirubin Urine: NEGATIVE
Glucose, UA: NEGATIVE mg/dL
Ketones, ur: NEGATIVE mg/dL
Nitrite: NEGATIVE
Protein, ur: 100 mg/dL — AB
Specific Gravity, Urine: 1.02 (ref 1.005–1.030)
pH: 5.5 (ref 5.0–8.0)

## 2021-01-20 LAB — POC URINE PREG, ED: Preg Test, Ur: NEGATIVE

## 2021-01-20 LAB — LACTIC ACID, PLASMA: Lactic Acid, Venous: 1.5 mmol/L (ref 0.5–1.9)

## 2021-01-20 MED ORDER — LACTATED RINGERS IV BOLUS
1000.0000 mL | Freq: Once | INTRAVENOUS | Status: AC
  Administered 2021-01-20: 1000 mL via INTRAVENOUS

## 2021-01-20 MED ORDER — ACETAMINOPHEN 325 MG PO TABS: 650.0000 mg | ORAL_TABLET | Freq: Four times a day (QID) | ORAL | Status: DC | PRN

## 2021-01-20 MED ORDER — ONDANSETRON HCL 4 MG/2ML IJ SOLN: 4.0000 mg | Freq: Once | INTRAMUSCULAR | Status: AC

## 2021-01-20 MED ORDER — SODIUM CHLORIDE 0.9 % IV BOLUS
1000.0000 mL | Freq: Once | INTRAVENOUS | Status: AC
  Administered 2021-01-21: 1000 mL via INTRAVENOUS

## 2021-01-20 MED ORDER — SODIUM CHLORIDE 0.9 % IV SOLN
1.0000 g | Freq: Once | INTRAVENOUS | Status: AC
  Administered 2021-01-21: 1 g via INTRAVENOUS
  Filled 2021-01-20: qty 10

## 2021-01-20 MED ORDER — ONDANSETRON HCL 4 MG/2ML IJ SOLN
INTRAMUSCULAR | Status: AC
  Administered 2021-01-20: 4 mg via INTRAVENOUS
  Filled 2021-01-20: qty 2

## 2021-01-20 MED ORDER — IOHEXOL 350 MG/ML SOLN
75.0000 mL | Freq: Once | INTRAVENOUS | Status: AC | PRN
  Administered 2021-01-20: 75 mL via INTRAVENOUS

## 2021-01-20 NOTE — ED Provider Notes (Signed)
[facility] Emergency Department Provider Note  ____________________________________________   I have reviewed the triage vital signs and the nursing notes.   HISTORY  Chief Complaint Abdominal Pain   History limited by: Not Limited   HPI April Mckee is a 30 y.o. female who presents to the emergency department today because of concern for abdominal pain, fever, vomiting. The patient states that her symptoms started roughly a week ago. Initial symptom was the fever.  She then developed the abdominal pain with nausea.  She states that the pain is located in her upper abdomen.  She has not noticed any bloody stool.  She denies any change in urination recently.  She does state a few weeks ago she noticed some blood in her urine although did not have any pain at that time.  The patient denies any unusual ingestions recently.    Records reviewed. Per medical record review patient has a history of ectopic pregnancy requiring laparoscopic surgery.   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    Past Surgical History:  Procedure Laterality Date   DIAGNOSTIC LAPAROSCOPY WITH REMOVAL OF ECTOPIC PREGNANCY Right 09/19/2016   Procedure: DIAGNOSTIC LAPAROSCOPY WITH REMOVAL OF ECTOPIC PREGNANCY;  Surgeon: Nadara Mustard, MD;  Location: [facility] OR[facility];  Service: Gynecology;  Laterality: Right;   LAPAROSCOPIC UNILATERAL SALPINGECTOMY Right 09/19/2016   Procedure: LAPAROSCOPIC RIGHT SALPINGECTOMY;  Surgeon: Nadara Mustard, MD;  Location: [facility] OR[facility];  Service: Gynecology;  Laterality: Right;   NO PAST SURGERIES      Prior to Admission medications   Medication Sig Start Date End Date Taking? Authorizing Provider  amoxicillin (AMOXIL) 875 MG tablet Take 1 tablet (875 mg total) by mouth 2 (two) times daily. 01/12/18   Renford Dills, NP    Allergies Patient has no known allergies.  Family History  Problem Relation Age of Onset    Healthy Mother     Social History Social History   Tobacco Use   Smoking status: Never   Smokeless tobacco: Never  Vaping Use   Vaping Use: Never used  Substance Use Topics   Alcohol use: No   Drug use: No    Review of Systems Constitutional: Positive for fevers. Eyes: No visual changes. ENT: No sore throat. Cardiovascular: Denies chest pain. Respiratory: Denies shortness of breath. Gastrointestinal: Positive for abdominal pain, nausea, vomiting.  Genitourinary: Negative for dysuria. Musculoskeletal: Negative for back pain. Skin: Negative for rash. Neurological: Negative for headaches, focal weakness or numbness.  ____________________________________________   PHYSICAL EXAM:  VITAL SIGNS: ED Triage Vitals  Enc Vitals Group     BP 01/20/21 2058 (!) 112/59     Pulse Rate 01/20/21 2058 (!) 130     Resp 01/20/21 2058 16     Temp 01/20/21 2058 100 F (37.8 C)     Temp Source 01/20/21 2058 Oral     SpO2 01/20/21 2058 100 %     Weight 01/20/21 2059 141 lb (64 kg)     Height 01/20/21 2059 5\' 1"  (1.549 m)     Head Circumference --      Peak Flow --      Pain Score 01/20/21 2058 5   Constitutional: Alert and oriented.  Eyes: Conjunctivae are normal.  ENT      Head: Normocephalic and atraumatic.      Nose: No congestion/rhinnorhea.      Mouth/Throat: Mucous membranes are moist.      Neck: No  stridor. Hematological/Lymphatic/Immunilogical: No cervical lymphadenopathy. Cardiovascular: Normal rate, regular rhythm.  No murmurs, rubs, or gallops.  Respiratory: Normal respiratory effort without tachypnea nor retractions. Breath sounds are clear and equal bilaterally. No wheezes/rales/rhonchi. Gastrointestinal: Soft. Somewhat diffusely tender to palpation, worse in the upper abdomen. Right CVA tenderness.  Genitourinary: Deferred Musculoskeletal: Normal range of motion in all extremities. No lower extremity edema. Neurologic:  Normal speech and language. No gross focal  neurologic deficits are appreciated.  Skin:  Skin is warm, dry and intact. No rash noted. Psychiatric: Mood and affect are normal. Speech and behavior are normal. Patient exhibits appropriate insight and judgment.  ____________________________________________    LABS (pertinent positives/negatives)  Upreg negative CBC wbc 14.3, hgb 11.9, plt 226 UA cloudy, large hgb dipstick, trace leukocytes, 21-50 RBC and WBC, rare bacteria, 11-20 Squamous epi, WBC present CMP na 130, k 3.7, glu 153, cr 1.26  ____________________________________________   EKG  None  ____________________________________________    RADIOLOGY  [place] RUQ  No acute abnormality  CT abd/pel Findings consistent with right sided pyelonephritis. No kidney stone. ____________________________________________   PROCEDURES  Procedures  ____________________________________________   INITIAL IMPRESSION / ASSESSMENT AND PLAN / ED COURSE  Pertinent labs & imaging results that were available during my care of the patient were reviewed by me and considered in my medical decision making (see chart for details).   Patient presents to the emergency department today because of concerns for abdominal pain, vomiting and fever.  Work-up in the emergency department is consistent with a pyelonephritis.  I did obtain a CT scan to make sure that there was not a kidney stone.  This did not show a kidney stone.  Patient was given dose of IV antibiotics here in the emergency department.  I discussed the finding pyelonephritis with the patient.  At this time I think it is reasonable for patient to be treated as an outpatient.  However he did however discuss strict return precautions for any worsening symptoms.  ____________________________________________   FINAL CLINICAL IMPRESSION(S) / ED DIAGNOSES  Final diagnoses:  RUQ pain  Pyelonephritis     Note: This dictation was prepared with Dragon dictation. Any transcriptional  errors that result from this process are unintentional     Phineas Semen, MD 01/21/21 [phone]

## 2021-01-20 NOTE — ED Triage Notes (Signed)
Pt states a week of fever and abd pain. Pt states has been vomiting. Pt states took negative covid test on Wednesday.

## 2021-01-20 NOTE — ED Notes (Signed)
Pt to ultrasound

## 2021-01-21 MED ORDER — KETOROLAC TROMETHAMINE 30 MG/ML IJ SOLN
30.0000 mg | Freq: Once | INTRAMUSCULAR | Status: AC
  Administered 2021-01-21: 30 mg via INTRAVENOUS
  Filled 2021-01-21: qty 1

## 2021-01-21 MED ORDER — CEPHALEXIN 500 MG PO CAPS: 500.0000 mg | ORAL_CAPSULE | Freq: Four times a day (QID) | ORAL | 0 refills | Status: AC

## 2021-01-21 NOTE — Discharge Instructions (Addendum)
Please seek medical attention for any high fevers, chest pain, shortness of breath, change in behavior, persistent vomiting, bloody stool or any other new or concerning symptoms.  

## 2021-09-24 ENCOUNTER — Other Ambulatory Visit: Payer: Self-pay

## 2021-09-24 ENCOUNTER — Emergency Department
Admission: EM | Admit: 2021-09-24 | Discharge: 2021-09-24 | Disposition: A | Discharge status: Home / Self Care | Payer: 59 | Attending: Emergency Medicine | Admitting: Emergency Medicine

## 2021-09-24 DIAGNOSIS — R300 Dysuria: Secondary | ICD-10-CM | POA: Diagnosis present

## 2021-09-24 DIAGNOSIS — M545 Low back pain, unspecified: Secondary | ICD-10-CM | POA: Insufficient documentation

## 2021-09-24 DIAGNOSIS — N3000 Acute cystitis without hematuria: Secondary | ICD-10-CM | POA: Insufficient documentation

## 2021-09-24 LAB — URINALYSIS, ROUTINE W REFLEX MICROSCOPIC
Bilirubin Urine: NEGATIVE
Glucose, UA: NEGATIVE mg/dL
Hgb urine dipstick: NEGATIVE
Ketones, ur: NEGATIVE mg/dL
Nitrite: NEGATIVE
Protein, ur: NEGATIVE mg/dL
Specific Gravity, Urine: 1.006 (ref 1.005–1.030)
pH: 7 (ref 5.0–8.0)

## 2021-09-24 MED ORDER — CEPHALEXIN 500 MG PO CAPS
500.0000 mg | ORAL_CAPSULE | Freq: Once | ORAL | Status: AC
  Administered 2021-09-24: 500 mg via ORAL
  Filled 2021-09-24: qty 1

## 2021-09-24 MED ORDER — CEPHALEXIN 500 MG PO CAPS: 500.0000 mg | ORAL_CAPSULE | Freq: Three times a day (TID) | ORAL | 0 refills | Status: AC

## 2021-09-24 MED ORDER — PHENAZOPYRIDINE HCL 100 MG PO TABS: 100.0000 mg | ORAL_TABLET | Freq: Three times a day (TID) | ORAL | 0 refills | Status: AC | PRN

## 2021-09-24 NOTE — Discharge Instructions (Signed)
Take the antibiotic as directed and the bladder pain medicine as needed. Follow-up with a local urgent care for continued symptoms. Return if needed.  ?

## 2021-09-24 NOTE — ED Triage Notes (Signed)
Pt to ED for 2 days of pain and burning with urination, and low back pain on L back. ? ?States wanted to make sure she is not developing kidney infx because last year she did get a kidney infx from UTI. ? ?Denies hematuria. VSS, afebrile. ?

## 2021-09-24 NOTE — ED Provider Notes (Signed)
? ? ?[facility] ?Emergency Department Provider Note ? ? ? ? Event Date/Time  ? First MD Initiated Contact with Patient 09/24/21 1737   ?  (approximate) ? ? ?History  ? ?Low back pain and Dysuria ? ? ?HPI ? ?April Mckee is a 31 y.o. female presents to the ED for evaluation of 2 days of dysuria and urinary frequency.  Patient reports some mild low back pain but denies any gross hematuria, urinary retention, fevers, nausea or vomiting.  She reports previous UTIs, notes symptoms are similar. ? ? ?Physical Exam  ? ?Triage Vital Signs: ?ED Triage Vitals  ?Enc Vitals Group  ?   BP 09/24/21 1714 105/77  ?   Pulse Rate 09/24/21 1714 60  ?   Resp 09/24/21 1714 16  ?   Temp 09/24/21 1714 98.3 ?F (36.8 ?C)  ?   Temp Source 09/24/21 1714 Oral  ?   SpO2 09/24/21 1714 100 %  ?   Weight 09/24/21 1712 159 lb (72.1 kg)  ?   Height 09/24/21 1712 5\' 1"  (1.549 m)  ?   Head Circumference --   ?   Peak Flow --   ?   Pain Score 09/24/21 1711 8  ?   Pain Loc --   ?   Pain Edu? --   ?   Excl. in GC? --   ? ? ?Most recent vital signs: ?Vitals:  ? 09/24/21 1714  ?BP: 105/77  ?Pulse: 60  ?Resp: 16  ?Temp: 98.3 ?F (36.8 ?C)  ?SpO2: 100%  ? ? ?General Awake, no distress.  ?CV:  Good peripheral perfusion.  ?RESP:  Normal effort.  ?ABD:  No distention.  Soft nontender.  No CVA tenderness elicited. ? ? ?ED Results / Procedures / Treatments  ? ?Labs ?(all labs ordered are listed, but only abnormal results are displayed) ?Labs Reviewed  ?URINALYSIS, ROUTINE W REFLEX MICROSCOPIC - Abnormal; Notable for the following components:  ?    Result Value  ? Color, Urine STRAW (*)   ? APPearance HAZY (*)   ? Leukocytes,Ua LARGE (*)   ? Bacteria, UA MANY (*)   ? All other components within normal limits  ?POC URINE PREG, ED  ? ? ? ?EKG ? ? ? ?RADIOLOGY ? ? ? ?No results found. ? ? ?PROCEDURES: ? ?Critical Care performed: No ? ?Procedures ? ? ?MEDICATIONS ORDERED IN ED: ?Medications  ?cephALEXin (KEFLEX) capsule 500 mg (500 mg Oral Given  09/24/21 1821)  ? ? ? ?IMPRESSION / MDM / ASSESSMENT AND PLAN / ED COURSE  ?I reviewed the triage vital signs and the nursing notes. ?             ?               ? ?Differential diagnosis includes, but is not limited to, UTI, pyelonephritis, vaginitis ? ?Patient to the ED with 2 days of dysuria and urinary frequency.  She denies any associated fever, chills, sweats, nausea, vomiting, or diarrhea.  Patient's clinical picture is consistent with UTI.  Patient will be discharged home with prescriptions for Keflex and phenazopyridine. Patient is to follow up with local urgent care as needed or otherwise directed. Patient is given ED precautions to return to the ED for any worsening or new symptoms. ? ? ?FINAL CLINICAL IMPRESSION(S) / ED DIAGNOSES  ? ?Final diagnoses:  ?Acute cystitis without hematuria  ? ? ? ?Rx / DC Orders  ? ?ED Discharge Orders   ? ?  Ordered  ?  cephALEXin (KEFLEX) 500 MG capsule  3 times daily       ? 09/24/21 1815  ?  phenazopyridine (PYRIDIUM) 100 MG tablet  3 times daily PRN       ? 09/24/21 1815  ? ?  ?  ? ?  ? ? ? ?Note:  This document was prepared using Dragon voice recognition software and may include unintentional dictation errors. ? ?  ?Lissa Hoard, PA-C ?09/24/21 1835 ? ?  ?Minna Antis, MD ?09/24/21 2233 ? ?

## 2021-09-25 LAB — POC URINE PREG, ED: Preg Test, Ur: NEGATIVE

## 2021-10-14 ENCOUNTER — Encounter: Payer: Self-pay | Admitting: Emergency Medicine

## 2021-10-14 ENCOUNTER — Other Ambulatory Visit: Payer: Self-pay

## 2021-10-14 ENCOUNTER — Emergency Department
Admission: EM | Admit: 2021-10-14 | Discharge: 2021-10-14 | Disposition: A | Discharge status: Home / Self Care | Payer: 59 | Attending: Emergency Medicine | Admitting: Emergency Medicine

## 2021-10-14 DIAGNOSIS — R300 Dysuria: Secondary | ICD-10-CM | POA: Diagnosis present

## 2021-10-14 DIAGNOSIS — N3000 Acute cystitis without hematuria: Secondary | ICD-10-CM | POA: Insufficient documentation

## 2021-10-14 MED ORDER — CEPHALEXIN 500 MG PO CAPS: 500.0000 mg | ORAL_CAPSULE | Freq: Two times a day (BID) | ORAL | 0 refills | Status: DC

## 2021-10-14 NOTE — ED Triage Notes (Signed)
Pt to ED via POV. Pt states that seen a few weeks ago for UTI. Pt states that she finished all her meds and symptoms went away. Pt states that yesterday she started having symptom again. Pt reports that she has not had fever or back pain. Pt is in NAD.  ?

## 2021-10-14 NOTE — ED Provider Notes (Signed)
? ?  [facility] ?Provider Note ? ? ? Event Date/Time  ? First MD Initiated Contact with Patient 10/14/21 1229   ?  (approximate) ? ? ?History  ? ?UTI ? ? ?HPI ? ?April Mckee is a 31 y.o. female with no significant past medical history who presents with complaints of dysuria and frequent urination.  Patient reports this started yesterday.  She is concerned because in the past she has had a kidney infection.  No fevers chills nausea or vomiting ?  ? ? ?Physical Exam  ? ?Triage Vital Signs: ?ED Triage Vitals  ?Enc Vitals Group  ?   BP 10/14/21 1218 100/82  ?   Pulse Rate 10/14/21 1218 78  ?   Resp 10/14/21 1218 16  ?   Temp 10/14/21 1218 98.5 ?F (36.9 ?C)  ?   Temp Source 10/14/21 1218 Oral  ?   SpO2 10/14/21 1218 99 %  ?   Weight 10/14/21 1217 72 kg (158 lb 11.7 oz)  ?   Height 10/14/21 1217 1.549 m (5\' 1" )  ?   Head Circumference --   ?   Peak Flow --   ?   Pain Score 10/14/21 1216 5  ?   Pain Loc --   ?   Pain Edu? --   ?   Excl. in GC? --   ? ? ?Most recent vital signs: ?Vitals:  ? 10/14/21 1218  ?BP: 100/82  ?Pulse: 78  ?Resp: 16  ?Temp: 98.5 ?F (36.9 ?C)  ?SpO2: 99%  ? ? ? ?General: Awake, no distress.  ?CV:  Good peripheral perfusion.  ?Resp:  Normal effort.  ?Abd:  No distention.  No CVA tenderness ?Other:   ? ? ?ED Results / Procedures / Treatments  ? ?Labs ?(all labs ordered are listed, but only abnormal results are displayed) ?Labs Reviewed - No data to display ? ? ?EKG ? ? ? ? ?RADIOLOGY ? ? ? ? ?PROCEDURES: ? ?Critical Care performed:  ? ?Procedures ? ? ?MEDICATIONS ORDERED IN ED: ?Medications - No data to display ? ? ?IMPRESSION / MDM / ASSESSMENT AND PLAN / ED COURSE  ?I reviewed the triage vital signs and the nursing notes. ? ?Patient presents with dysuria as above ? ?Differential includes UTI, pyelonephritis ? ?Vital signs and exam are reassuring, no CVA tenderness, no tachycardia. ? ?Consistent with UTI, will treat the patient with Keflex outpatient follow-up with urology given  recurrent UTIs, return precautions discussed ? ? ? ? ? ?  ? ? ?FINAL CLINICAL IMPRESSION(S) / ED DIAGNOSES  ? ?Final diagnoses:  ?Acute cystitis without hematuria  ? ? ? ?Rx / DC Orders  ? ?ED Discharge Orders   ? ?      Ordered  ?  cephALEXin (KEFLEX) 500 MG capsule  2 times daily       ? 10/14/21 1229  ? ?  ?  ? ?  ? ? ? ?Note:  This document was prepared using Dragon voice recognition software and may include unintentional dictation errors. ?  ?10/16/21, MD ?10/14/21 1429 ? ?

## 2021-10-14 NOTE — ED Provider Triage Note (Signed)
Emergency Medicine Provider Triage Evaluation Note ? ?April Mckee , a 31 y.o. female  was evaluated in triage.  Pt complains of dysuria. ? ?Review of Systems  ?Positive: Dysuria ?Negative: Back pain ? ?Physical Exam  ?There were no vitals taken for this visit. ?Gen:   Awake, no distress  Resp:  Normal effort  ?MSK:   Moves extremities without difficulty  ?Other:   ? ?Medical Decision Making  ?Medically screening exam initiated at 12:16 PM.  Appropriate orders placed.  April Mckee was informed that the remainder of the evaluation will be completed by another provider, this initial triage assessment does not replace that evaluation, and the importance of remaining in the ED until their evaluation is complete. ? ? ?  ?April Drafts, MD ?10/14/21 1439 ? ?

## 2022-06-29 ENCOUNTER — Emergency Department
Admission: EM | Admit: 2022-06-29 | Discharge: 2022-06-29 | Disposition: A | Discharge status: Home / Self Care | Payer: 59 | Attending: Emergency Medicine | Admitting: Emergency Medicine

## 2022-06-29 ENCOUNTER — Emergency Department: Payer: 59

## 2022-06-29 ENCOUNTER — Other Ambulatory Visit: Payer: Self-pay

## 2022-06-29 DIAGNOSIS — R102 Pelvic and perineal pain: Secondary | ICD-10-CM | POA: Insufficient documentation

## 2022-06-29 DIAGNOSIS — O209 Hemorrhage in early pregnancy, unspecified: Secondary | ICD-10-CM | POA: Diagnosis present

## 2022-06-29 DIAGNOSIS — O4690 Antepartum hemorrhage, unspecified, unspecified trimester: Secondary | ICD-10-CM

## 2022-06-29 DIAGNOSIS — Z3A00 Weeks of gestation of pregnancy not specified: Secondary | ICD-10-CM | POA: Insufficient documentation

## 2022-06-29 LAB — HCG, QUANTITATIVE, PREGNANCY: hCG, Beta Chain, Quant, S: 2227 m[IU]/mL — ABNORMAL HIGH (ref <5)

## 2022-06-29 LAB — CBC
HCT: 37.1 % (ref 36.0–46.0)
Hemoglobin: 12.4 g/dL (ref 12.0–15.0)
MCH: 30.9 pg (ref 26.0–34.0)
MCHC: 33.4 g/dL (ref 30.0–36.0)
MCV: 92.5 fL (ref 80.0–100.0)
Platelets: 294 10*3/uL (ref 150–400)
RBC: 4.01 MIL/uL (ref 3.87–5.11)
RDW: 11.8 % (ref 11.5–15.5)
WBC: 5.1 10*3/uL (ref 4.0–10.5)
nRBC: 0 % (ref 0.0–0.2)

## 2022-06-29 LAB — URINALYSIS, ROUTINE W REFLEX MICROSCOPIC
Bacteria, UA: NONE SEEN
Bilirubin Urine: NEGATIVE
Glucose, UA: NEGATIVE mg/dL
Ketones, ur: NEGATIVE mg/dL
Leukocytes,Ua: NEGATIVE
Nitrite: NEGATIVE
Protein, ur: NEGATIVE mg/dL
Specific Gravity, Urine: 1.015 (ref 1.005–1.030)
WBC, UA: NONE SEEN WBC/hpf (ref 0–5)
pH: 7 (ref 5.0–8.0)

## 2022-06-29 LAB — BASIC METABOLIC PANEL
Anion gap: 9 (ref 5–15)
BUN: 11 mg/dL (ref 6–20)
CO2: 22 mmol/L (ref 22–32)
Calcium: 8.7 mg/dL — ABNORMAL LOW (ref 8.9–10.3)
Chloride: 105 mmol/L (ref 98–111)
Creatinine, Ser: 0.68 mg/dL (ref 0.44–1.00)
GFR, Estimated: >60 mL/min (ref >60)
Glucose, Bld: 106 mg/dL — ABNORMAL HIGH (ref 70–99)
Potassium: 3.5 mmol/L (ref 3.5–5.1)
Sodium: 136 mmol/L (ref 135–145)

## 2022-06-29 LAB — TYPE AND SCREEN
ABO/RH(D): B POS
Antibody Screen: NEGATIVE

## 2022-06-29 NOTE — ED Provider Notes (Signed)
[facility] Provider Note  Patient Contact: 8:05 PM (approximate)   History   Vaginal Bleeding (Possible ectopic )   HPI  April Mckee is a 32 y.o. female G2, P0, presents to the emergency department with some lower pelvic cramping and some vaginal bleeding that started last night.  Patient describes the vaginal bleeding as light in nature.  She denies nausea, vomiting or fever.  She has had 1 prior ectopic pregnancy in the past.  No chest pain, chest tightness or shortness of breath.      Physical Exam   Triage Vital Signs: ED Triage Vitals  Enc Vitals Group     BP 06/29/22 1503 98/62     Pulse Rate 06/29/22 1503 78     Resp 06/29/22 1503 16     Temp 06/29/22 1503 98.5 F (36.9 C)     Temp Source 06/29/22 1503 Oral     SpO2 06/29/22 1503 100 %     Weight 06/29/22 1500 165 lb (74.8 kg)     Height 06/29/22 1500 5\' 1"  (1.549 m)     Head Circumference --      Peak Flow --      Pain Score 06/29/22 1459 8     Pain Loc --      Pain Edu? --      Excl. in [place]? --     Most recent vital signs: Vitals:   06/29/22 1909 06/29/22 2131  BP: 104/70 106/73  Pulse: 68 61  Resp: 17 17  Temp: 98.1 F (36.7 C) 98 F (36.7 C)  SpO2: 100% 100%     General: Alert and in no acute distress. Eyes:  PERRL. EOMI. Head: No acute traumatic findings ENT:      Nose: No congestion/rhinnorhea.      Mouth/Throat: Mucous membranes are moist. Neck: No stridor. No cervical spine tenderness to palpation. Cardiovascular:  Good peripheral perfusion Respiratory: Normal respiratory effort without tachypnea or retractions. Lungs CTAB. Good air entry to the bases with no decreased or absent breath sounds. Gastrointestinal: Bowel sounds 4 quadrants. Soft and nontender to palpation. No guarding or rigidity. No palpable masses. No distention. No CVA tenderness. Musculoskeletal: Full range of motion to all extremities.  Neurologic:  No gross focal neurologic deficits are  appreciated.  Skin:   No rash noted Other:   ED Results / Procedures / Treatments   Labs (all labs ordered are listed, but only abnormal results are displayed) Labs Reviewed  HCG, QUANTITATIVE, PREGNANCY - Abnormal; Notable for the following components:      Result Value   hCG, Beta Chain, Quant, S 2,227 (*)    All other components within normal limits  BASIC METABOLIC PANEL - Abnormal; Notable for the following components:   Glucose, Bld 106 (*)    Calcium 8.7 (*)    All other components within normal limits  URINALYSIS, ROUTINE W REFLEX MICROSCOPIC - Abnormal; Notable for the following components:   Color, Urine YELLOW (*)    APPearance CLOUDY (*)    Hgb urine dipstick MODERATE (*)    All other components within normal limits  CBC  POC URINE PREG, ED  TYPE AND SCREEN  TYPE AND SCREEN        RADIOLOGY  I personally viewed and evaluated these images as part of my medical decision making, as well as reviewing the written report by the radiologist.  ED Provider Interpretation: No evidence of intrauterine pregnancy  PROCEDURES:  Critical Care performed: No  Procedures   MEDICATIONS ORDERED IN ED: Medications - No data to display   IMPRESSION / MDM / [place] / ED COURSE  I reviewed the triage vital signs and the nursing notes.                              Assessment and plan Vaginal bleeding in pregnancy 32 year old female presents to the emergency department with cramping and a small amount of vaginal bleeding.  Vital signs were reassuring at triage.  On exam, patient was alert and nontoxic-appearing.  Abdomen was soft and nontender.  Beta-hCG elevated at 2,227.  BMP reassuring.  Urinalysis indicates a moderate amount of hemoglobin consistent with patient's chief complaint.  CBC reassuring.  Dedicated OB ultrasound shows no intrauterine pregnancy.  I discussed beta-hCG and ultrasound findings with patient and recommended repeat beta-hCG in 48  hours as differential diagnosis includes viable intrauterine pregnancy, ectopic pregnancy, first trimester vaginal bleeding and miscarriage at this time.  Patient voiced understanding and has Easy Access to the emergency department.  Blood type is B+ patient does not require RhoGAM.     FINAL CLINICAL IMPRESSION(S) / ED DIAGNOSES   Final diagnoses:  Vaginal bleeding in pregnancy     Rx / DC Orders   ED Discharge Orders     None        Note:  This document was prepared using Dragon voice recognition software and may include unintentional dictation errors.   Vallarie Mare [place], Hershal Coria 06/29/22 2148    Rada Hay, MD 06/30/22 1739

## 2022-06-29 NOTE — ED Notes (Signed)
Patient reports vaginal bleeding since lastnight

## 2022-06-29 NOTE — Discharge Instructions (Signed)
Please return in 48 hours for repeat beta-hCG. Your blood type is be positive.  You do not need RhoGAM.

## 2022-06-29 NOTE — ED Triage Notes (Signed)
Pt then states I am currently pregnant. Took home test and went to [facility] and was told she is pregnant.

## 2022-06-29 NOTE — ED Triage Notes (Signed)
Pt to ED for pain in her right shoulder and lower back and some vaginal bleeding. Pt has HX of previous ectopic pregnancy. Symptoms started late last night. Pt last ectopic was 8 years ago and it ruptured and they had to remove it. Pt is CAOx4 and in no acute distress at this time and ambulatory in triage.

## 2022-06-29 NOTE — ED Notes (Signed)
Report received from Amy, RN.

## 2022-06-29 NOTE — ED Notes (Signed)
Spoke with Angelea in lab. States the type and screen was needed to be drawn, states a lab tech came to redraw earlier but a nurse turned them away. Lab was notified to come redraw. States they will send someone

## 2022-06-29 NOTE — ED Notes (Signed)
Lab tech at bedside

## 2022-07-04 ENCOUNTER — Ambulatory Visit: Payer: 59

## 2022-09-16 ENCOUNTER — Ambulatory Visit: Payer: Self-pay | Admitting: Podiatry

## 2023-01-12 ENCOUNTER — Ambulatory Visit
Admission: RE | Admit: 2023-01-12 | Discharge: 2023-01-12 | Disposition: A | Discharge status: Home / Self Care | Payer: 59 | Source: Ambulatory Visit | Attending: Family Medicine | Admitting: Family Medicine

## 2023-01-12 VITALS — BP 105/76 | HR 79 | Temp 98.8°F | Resp 16

## 2023-01-12 DIAGNOSIS — R590 Localized enlarged lymph nodes: Secondary | ICD-10-CM

## 2023-01-12 DIAGNOSIS — R519 Headache, unspecified: Secondary | ICD-10-CM

## 2023-01-12 MED ORDER — DEXAMETHASONE SODIUM PHOSPHATE 10 MG/ML IJ SOLN
10.0000 mg | Freq: Once | INTRAMUSCULAR | Status: AC
  Administered 2023-01-12: 10 mg via INTRAMUSCULAR

## 2023-01-12 MED ORDER — NAPROXEN 375 MG PO TABS: 375.0000 mg | ORAL_TABLET | Freq: Two times a day (BID) | ORAL | 0 refills | Status: DC

## 2023-01-12 MED ORDER — KETOROLAC TROMETHAMINE 30 MG/ML IJ SOLN
30.0000 mg | Freq: Once | INTRAMUSCULAR | Status: AC
  Administered 2023-01-12: 30 mg via INTRAMUSCULAR

## 2023-01-12 NOTE — Discharge Instructions (Signed)
If headache pain returns, recommend taking naprosyn 375 mg up twice daily as needed. The bumps on the right back of neck are lymph nodes, these are likely enlarged due to the recent illness you experienced. These symptoms are self limiting and ill resolve without interventions.

## 2023-01-12 NOTE — ED Triage Notes (Signed)
Patient to Urgent Care with complaints of headache/ neck/ shoulder/ upper back aches. Symptoms started two weeks ago.  Reports she has also had painful bumps on the back of her neck. Describes one feels marble size.  Denies any fevers. No home interventions. Negative home covid test.

## 2023-01-13 NOTE — ED Provider Notes (Signed)
Renaldo Fiddler    CSN: [id number] Arrival date & time: 01/12/23  0948      History   Chief Complaint Chief Complaint  Patient presents with   Headache    Back neck pain bumps on the back of my neck - Entered by patient    HPI April Mckee is a 32 y.o. female.   HPI Patient here with headache, neck , upper back aches. She is concern as she has small fine on the posterior neck extending down to the lower scalp.  No recent neck injury or recent illness.  She reports symptoms present x 2 weeks.  History reviewed. No pertinent past medical history.  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    Past Surgical History:  Procedure Laterality Date   DIAGNOSTIC LAPAROSCOPY WITH REMOVAL OF ECTOPIC PREGNANCY Right 09/19/2016   Procedure: DIAGNOSTIC LAPAROSCOPY WITH REMOVAL OF ECTOPIC PREGNANCY;  Surgeon: Nadara Mustard, MD;  Location: [facility] ORS;  Service: Gynecology;  Laterality: Right;   LAPAROSCOPIC UNILATERAL SALPINGECTOMY Right 09/19/2016   Procedure: LAPAROSCOPIC RIGHT SALPINGECTOMY;  Surgeon: Nadara Mustard, MD;  Location: [facility] ORS;  Service: Gynecology;  Laterality: Right;   NO PAST SURGERIES      OB History     Gravida  1   Para      Term      Preterm      AB  1   Living         SAB      IAB      Ectopic  1   Multiple      Live Births               Home Medications    Prior to Admission medications   Medication Sig Start Date End Date Taking? Authorizing Provider  naproxen (NAPROSYN) 375 MG tablet Take 1 tablet (375 mg total) by mouth 2 (two) times daily. 01/12/23  Yes Bing Neighbors, NP  cephALEXin (KEFLEX) 500 MG capsule Take 1 capsule (500 mg total) by mouth 2 (two) times daily. Patient not taking: Reported on 01/12/2023 10/14/21   Jene Every, MD    Family History Family History  Problem Relation Age of Onset   Healthy Mother     Social History Social History   Tobacco  Use   Smoking status: Never   Smokeless tobacco: Never  Vaping Use   Vaping status: Never Used  Substance Use Topics   Alcohol use: No   Drug use: No     Allergies   Patient has no known allergies.   Review of Systems Review of Systems Pertinent negatives listed in HPI   Physical Exam Triage Vital Signs ED Triage Vitals  Encounter Vitals Group     BP 01/12/23 1027 105/76     Systolic BP Percentile --      Diastolic BP Percentile --      Pulse Rate 01/12/23 1027 79     Resp 01/12/23 1027 16     Temp 01/12/23 1027 98.8 F (37.1 C)     Temp Source 01/12/23 1027 Oral     SpO2 01/12/23 1027 98 %     Weight --      Height --      Head Circumference --      Peak Flow --      Pain Score 01/12/23 1023 8     Pain Loc --  Pain Education --      Exclude from Growth Chart --    Updated Vital Signs BP 105/76 (BP Location: Left Arm)   Pulse 79   Temp 98.8 F (37.1 C) (Oral)   Resp 16   LMP 12/24/2022   SpO2 98%   Visual Acuity Right Eye Distance:   Left Eye Distance:   Bilateral Distance:    Right Eye Near:   Left Eye Near:    Bilateral Near:     Physical Exam Vitals reviewed.  Constitutional:      Appearance: She is well-developed.  HENT:     Head: Normocephalic and atraumatic.  Eyes:     Extraocular Movements: Extraocular movements intact.     Pupils: Pupils are equal, round, and reactive to light.  Neck:   Cardiovascular:     Rate and Rhythm: Normal rate and regular rhythm.  Pulmonary:     Effort: Pulmonary effort is normal.     Breath sounds: Normal breath sounds.  Musculoskeletal:     Cervical back: Normal range of motion and neck supple.  Lymphadenopathy:     Cervical: Cervical adenopathy present.  Skin:    General: Skin is warm and dry.  Neurological:     Mental Status: She is alert and oriented to person, place, and time.      UC Treatments / Results  Labs (all labs ordered are listed, but only abnormal results are displayed) Labs  Reviewed - No data to display  EKG   Radiology No results found.  Procedures Procedures (including critical care time)  Medications Ordered in UC Medications  ketorolac (TORADOL) 30 MG/ML injection 30 mg (30 mg Intramuscular Given 01/12/23 1104)  dexamethasone (DECADRON) injection 10 mg (10 mg Intramuscular Given 01/12/23 1100)    Initial Impression / Assessment and Plan / UC Course  I have reviewed the triage vital signs and the nursing notes.  Pertinent labs & imaging results that were available during my care of the patient were reviewed by me and considered in my medical decision making (see chart for details).    Frontal headache with posterior cervical  adenopathy present x 2 weeks, unknown etiology given no recent illness or injury. Headache cocktail given here in clinic: IM Decadron and IM Toradol. Patient will continue home management with Naprosyn 375 mg BID PRN Home COVID test negative, will not repeat today. Return precautions given.  Final Clinical Impressions(s) / UC Diagnoses   Final diagnoses:  Posterior cervical adenopathy  Frontal headache     Discharge Instructions      If headache pain returns, recommend taking naprosyn 375 mg up twice daily as needed. The bumps on the right back of neck are lymph nodes, these are likely enlarged due to the recent illness you experienced. These symptoms are self limiting and ill resolve without interventions.   ED Prescriptions     Medication Sig Dispense Auth. Provider   naproxen (NAPROSYN) 375 MG tablet Take 1 tablet (375 mg total) by mouth 2 (two) times daily. 20 tablet Bing Neighbors, NP      PDMP not reviewed this encounter.   Bing Neighbors, NP 01/15/23 1601

## 2023-01-28 ENCOUNTER — Ambulatory Visit
Admission: RE | Admit: 2023-01-28 | Discharge: 2023-01-28 | Disposition: A | Discharge status: Home / Self Care | Payer: 59 | Source: Ambulatory Visit | Attending: Emergency Medicine | Admitting: Emergency Medicine

## 2023-01-28 VITALS — BP 105/73 | HR 72 | Temp 98.2°F | Resp 18

## 2023-01-28 DIAGNOSIS — R519 Headache, unspecified: Secondary | ICD-10-CM | POA: Diagnosis not present

## 2023-01-28 MED ORDER — KETOROLAC TROMETHAMINE 30 MG/ML IJ SOLN
30.0000 mg | Freq: Once | INTRAMUSCULAR | Status: AC
  Administered 2023-01-28: 30 mg via INTRAMUSCULAR

## 2023-01-28 NOTE — ED Triage Notes (Signed)
Patient to Urgent Care with complaints of a throbbing, occipital headache. Mostly left sided. Denies any light/ sound sensitivity. Unsure of trigger.   Reports symptoms started increasing yesterday. Had some relief 2 weeks ago when she received IM medications. Symptoms started initially 2 months ago. Has had some relief at times.

## 2023-01-28 NOTE — Discharge Instructions (Addendum)
You were given an injection of ketorolac today for your headache.  Do not take over-the-counter medications today.      Establish a primary care provider.    Go to the emergency department if you have persistent or worsening symptoms.

## 2023-01-28 NOTE — ED Provider Notes (Signed)
Renaldo Fiddler    CSN: [id number] Arrival date & time: 01/28/23  1103      History   Chief Complaint Chief Complaint  Patient presents with   Headache    Entered by patient    HPI April Mckee is a 32 y.o. female.  Patient presents with 41-month history of recurrent headaches.  She developed a headache last night.  She also reports fatigue.  This is not the worst headache of her life.  No trauma.  No vision changes, numbness, weakness, dizziness, fever, chest pain, shortness of breath, nausea, vomiting, or other symptoms.  No OTC medications taken today.    Patient was seen at this urgent care on 01/12/2023; diagnosed with posterior cervical adenopathy and frontal headache; dexamethasone and ketorolac given; discharged on naproxen.  She states her headache resolved with this medication.  LMP: 01/23/2023.  No pertinent medical history.  The history is provided by the patient and medical records.    History reviewed. No pertinent past medical history.  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    Past Surgical History:  Procedure Laterality Date   DIAGNOSTIC LAPAROSCOPY WITH REMOVAL OF ECTOPIC PREGNANCY Right 09/19/2016   Procedure: DIAGNOSTIC LAPAROSCOPY WITH REMOVAL OF ECTOPIC PREGNANCY;  Surgeon: Nadara Mustard, MD;  Location: [facility];  Service: Gynecology;  Laterality: Right;   LAPAROSCOPIC UNILATERAL SALPINGECTOMY Right 09/19/2016   Procedure: LAPAROSCOPIC RIGHT SALPINGECTOMY;  Surgeon: Nadara Mustard, MD;  Location: [facility];  Service: Gynecology;  Laterality: Right;   NO PAST SURGERIES      OB History     Gravida  1   Para      Term      Preterm      AB  1   Living         SAB      IAB      Ectopic  1   Multiple      Live Births               Home Medications    Prior to Admission medications   Medication Sig Start Date End Date Taking? Authorizing Provider  naproxen (NAPROSYN) 375 MG  tablet Take 1 tablet (375 mg total) by mouth 2 (two) times daily. 01/12/23  Yes Bing Neighbors, NP  cephALEXin (KEFLEX) 500 MG capsule Take 1 capsule (500 mg total) by mouth 2 (two) times daily. Patient not taking: Reported on 01/12/2023 10/14/21   Jene Every, MD    Family History Family History  Problem Relation Age of Onset   Healthy Mother     Social History Social History   Tobacco Use   Smoking status: Never   Smokeless tobacco: Never  Vaping Use   Vaping status: Never Used  Substance Use Topics   Alcohol use: No   Drug use: No     Allergies   Patient has no known allergies.   Review of Systems Review of Systems  Constitutional:  Positive for fatigue. Negative for chills and fever.  HENT:  Negative for ear pain and sore throat.   Eyes:  Negative for pain and visual disturbance.  Respiratory:  Negative for cough and shortness of breath.   Cardiovascular:  Negative for chest pain and palpitations.  Gastrointestinal:  Negative for abdominal pain, nausea and vomiting.  Neurological:  Positive for headaches. Negative for dizziness, seizures, syncope, facial asymmetry, speech difficulty, weakness and numbness.  All other systems  reviewed and are negative.    Physical Exam Triage Vital Signs ED Triage Vitals  Encounter Vitals Group     BP      Systolic BP Percentile      Diastolic BP Percentile      Pulse      Resp      Temp      Temp src      SpO2      Weight      Height      Head Circumference      Peak Flow      Pain Score      Pain Loc      Pain Education      Exclude from Growth Chart    No data found.  Updated Vital Signs BP 105/73   Pulse 72   Temp 98.2 F (36.8 C)   Resp 18   LMP 01/23/2023   SpO2 98%   Visual Acuity Right Eye Distance:   Left Eye Distance:   Bilateral Distance:    Right Eye Near:   Left Eye Near:    Bilateral Near:     Physical Exam Vitals and nursing note reviewed.  Constitutional:      General: She is  not in acute distress.    Appearance: Normal appearance. She is well-developed.  HENT:     Head: Normocephalic and atraumatic.     Right Ear: Tympanic membrane normal.     Left Ear: Tympanic membrane normal.     Nose: Nose normal.     Mouth/Throat:     Mouth: Mucous membranes are moist.     Pharynx: Oropharynx is clear.  Eyes:     Pupils: Pupils are equal, round, and reactive to light.  Cardiovascular:     Rate and Rhythm: Normal rate and regular rhythm.     Heart sounds: Normal heart sounds.  Pulmonary:     Effort: Pulmonary effort is normal. No respiratory distress.     Breath sounds: Normal breath sounds.  Musculoskeletal:     Cervical back: Neck supple.  Skin:    General: Skin is warm and dry.  Neurological:     General: No focal deficit present.     Mental Status: She is alert and oriented to person, place, and time.     Cranial Nerves: No cranial nerve deficit.     Sensory: No sensory deficit.     Motor: No weakness.     Gait: Gait normal.  Psychiatric:        Mood and Affect: Mood normal.        Behavior: Behavior normal.      UC Treatments / Results  Labs (all labs ordered are listed, but only abnormal results are displayed) Labs Reviewed - No data to display  EKG   Radiology No results found.  Procedures Procedures (including critical care time)  Medications Ordered in UC Medications  ketorolac (TORADOL) 30 MG/ML injection 30 mg (30 mg Intramuscular Given 01/28/23 1136)    Initial Impression / Assessment and Plan / UC Course  I have reviewed the triage vital signs and the nursing notes.  Pertinent labs & imaging results that were available during my care of the patient were reviewed by me and considered in my medical decision making (see chart for details).    Recurrent headache.  Afebrile and vital signs are stable.  Exam is reassuring.  This is not the worst headache of her life.  No trauma.  She  is alert, active, well-appearing.  Treating today  with injection of ketorolac.  Caution patient to avoid OTC medications today.  Appointment scheduled for her to establish with a PCP.  ED precautions given.  Education provided on headache.  Patient agrees to plan of care.  Final Clinical Impressions(s) / UC Diagnoses   Final diagnoses:  Recurrent headache     Discharge Instructions      You were given an injection of ketorolac today for your headache.  Do not take over-the-counter medications today.      Establish a primary care provider.    Go to the emergency department if you have persistent or worsening symptoms.     ED Prescriptions   None    PDMP not reviewed this encounter.   Mickie Bail, NP 01/28/23 1145

## 2023-03-19 ENCOUNTER — Ambulatory Visit: Payer: Self-pay | Admitting: Nurse Practitioner

## 2023-06-11 IMAGING — US US ABDOMEN LIMITED
1 series · 14 of 25 positions shown · non-contrast
Comparison: None.

CLINICAL DATA: Right upper quadrant pain

EXAM:
ULTRASOUND ABDOMEN LIMITED RIGHT UPPER QUADRANT

[Series 1: us abdomen limited ruq (liver/gb) · 14 of 40 slices shown]
[im 1/40]
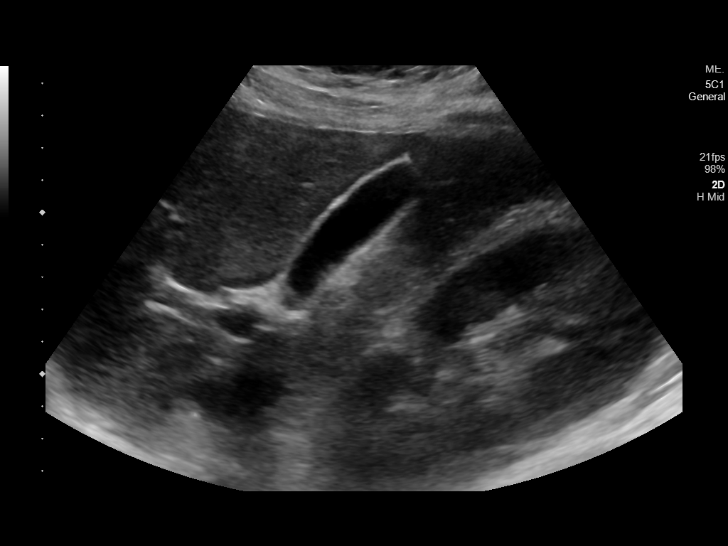
[im 4/40]
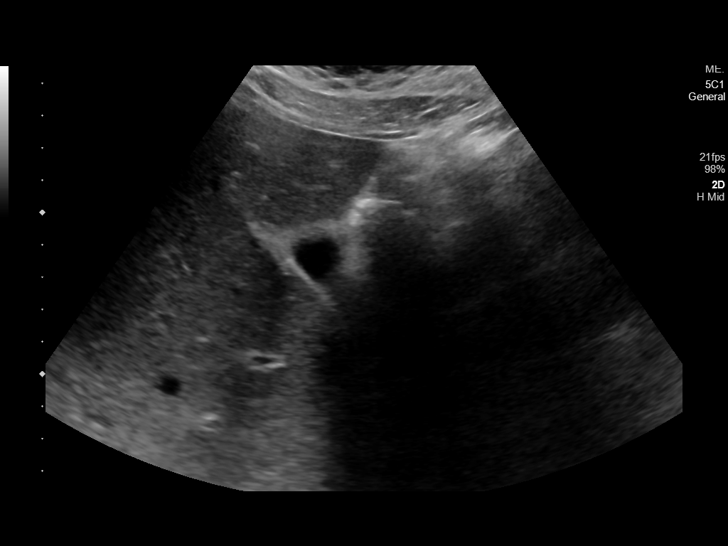
[im 7/40]
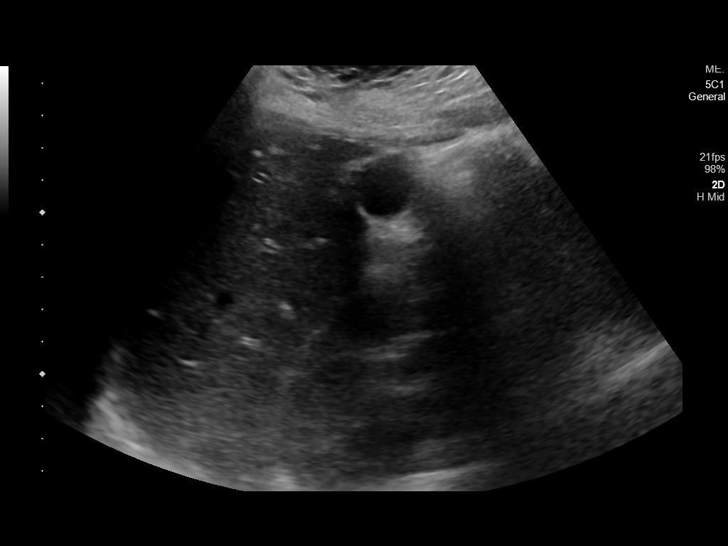
[im 10/40]
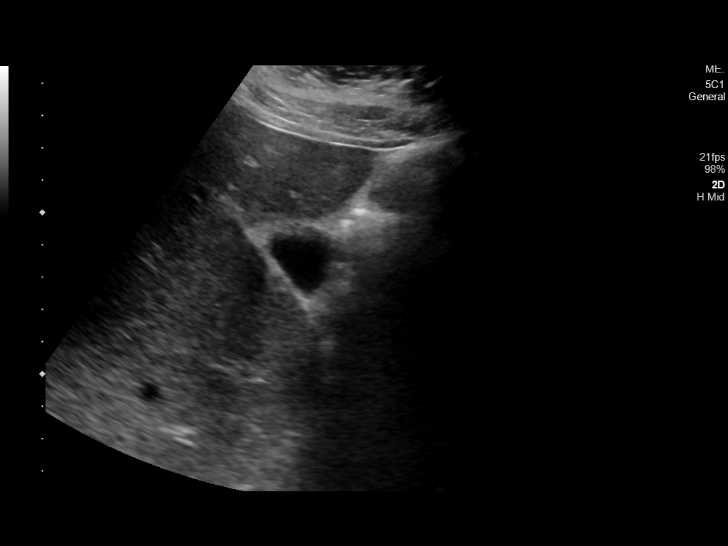
[im 14/40]
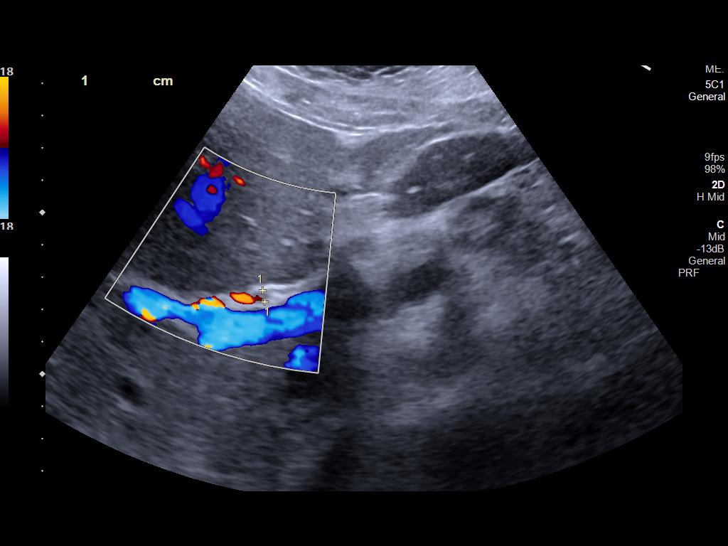
[im 15/40]
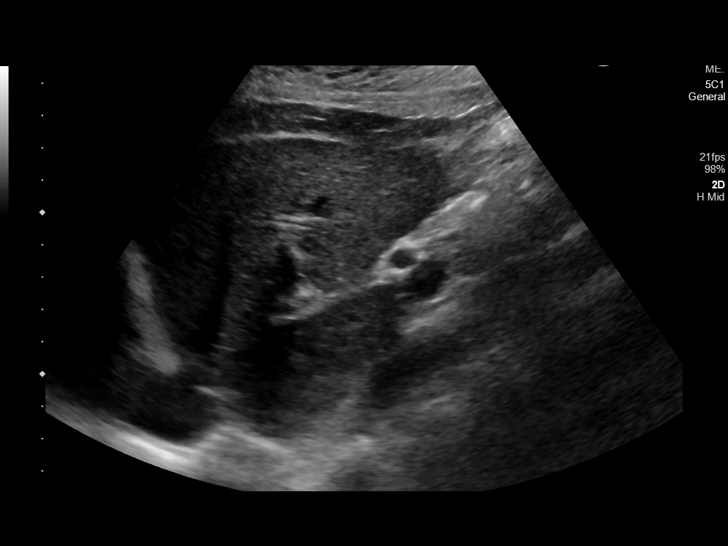
[im 18/40]
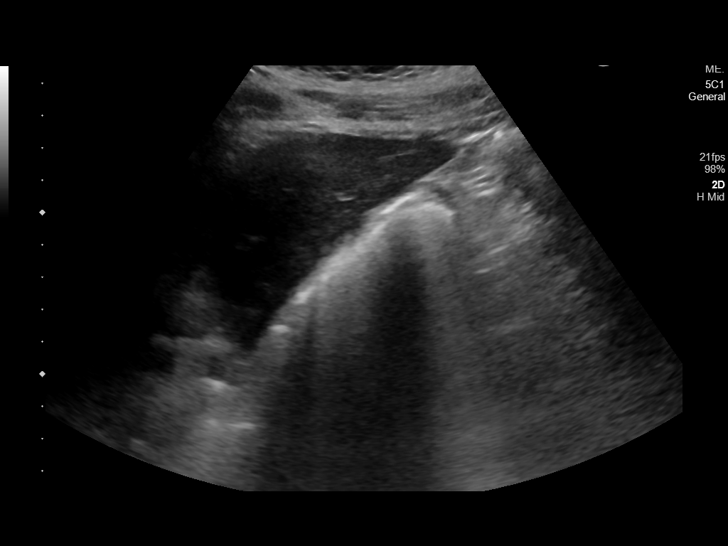
[im 22/40]
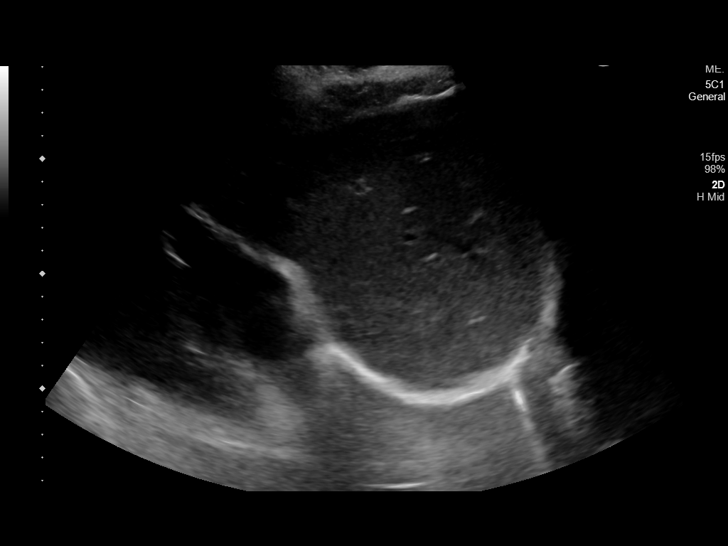
[im 25/40]
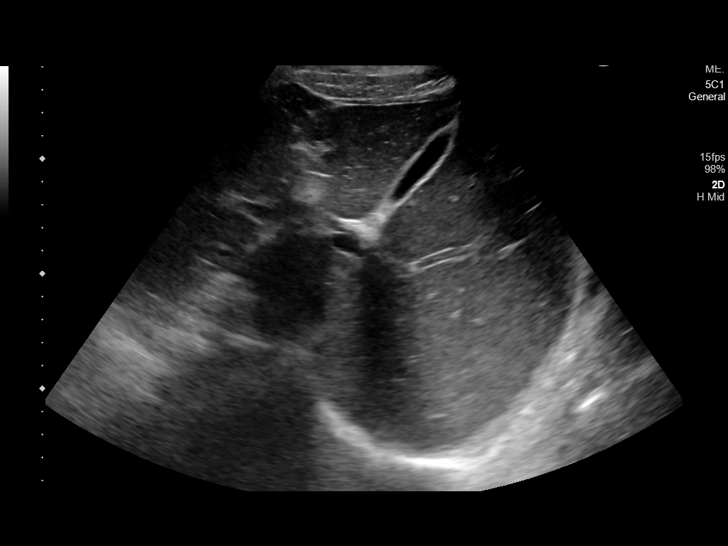
[im 27/40]
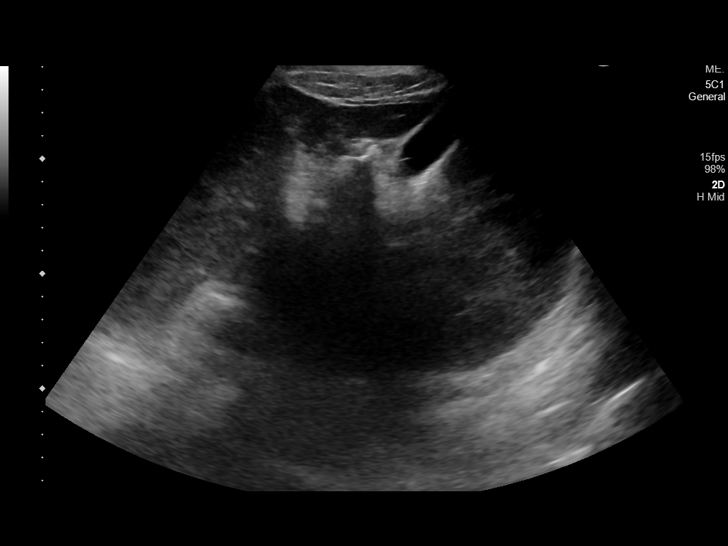
[im 30/40]
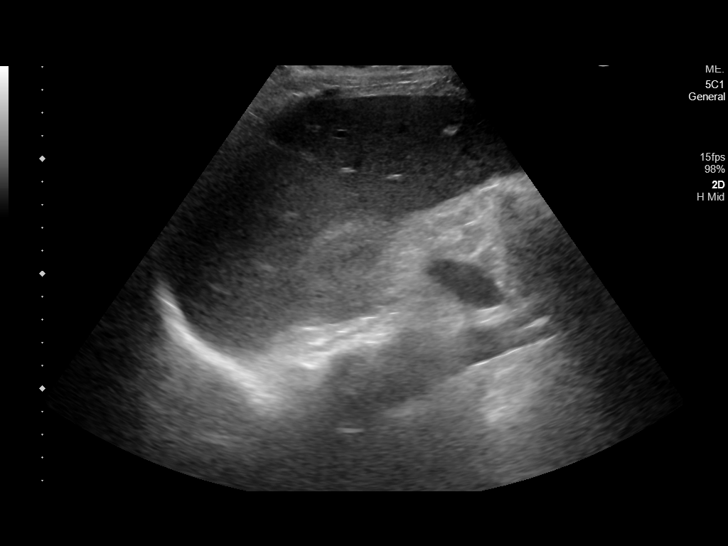
[im 33/40]
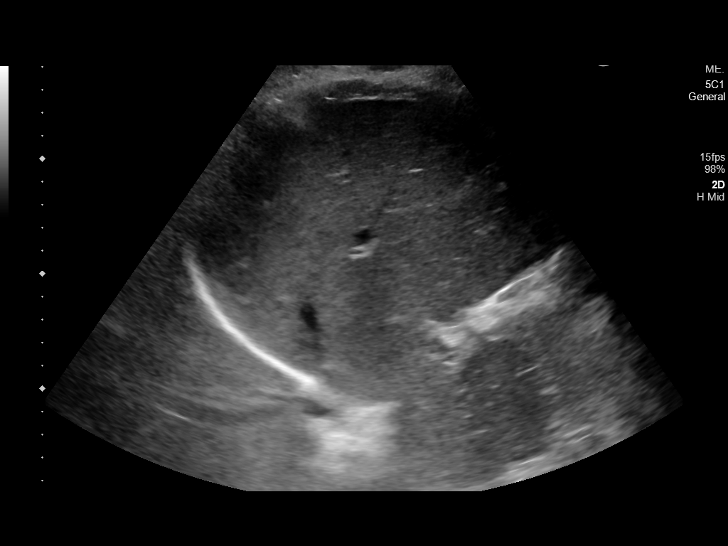
[im 36/40]
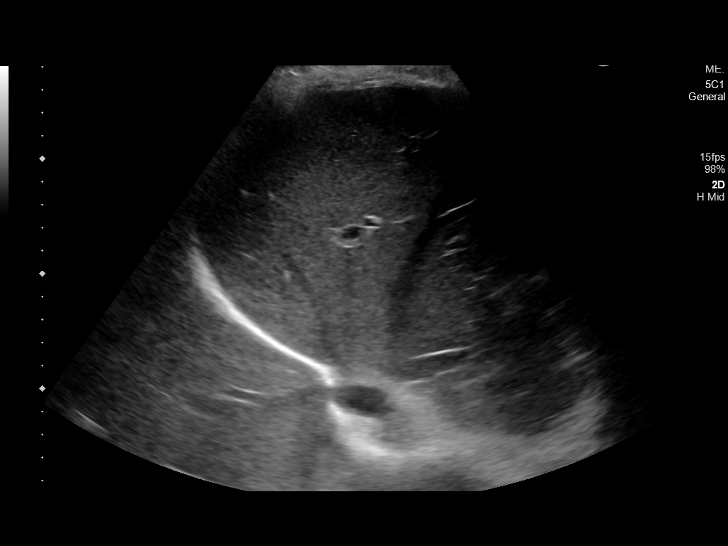
[im 40/40]
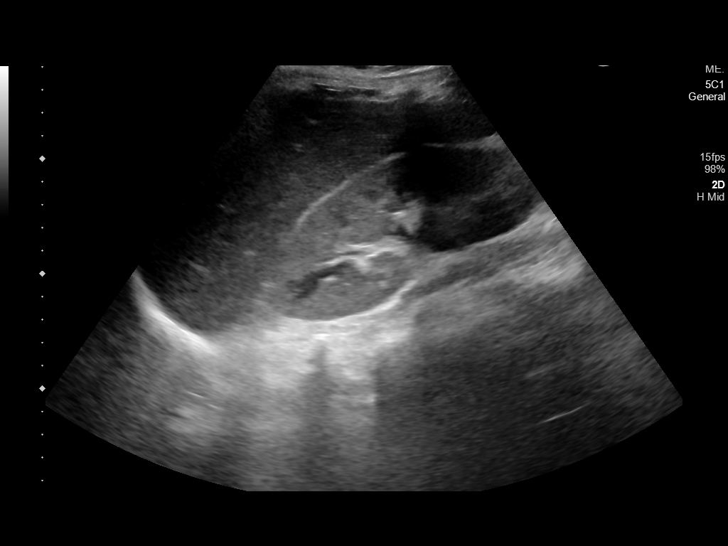

[14 of 25 positions shown; findings below may reference images not displayed]

FINDINGS: Gallbladder:

No gallstones or wall thickening visualized. No sonographic Murphy
sign noted by sonographer.

Common bile duct:

Diameter: Normal caliber, 4 mm

Liver:

There is portal vein is patent on color Doppler imaging with normal
direction of blood flow towards the liver.

Other: None.
IMPRESSION: Normal study.

## 2023-10-27 ENCOUNTER — Ambulatory Visit: Admitting: Podiatry

## 2023-11-11 ENCOUNTER — Encounter: Payer: Self-pay | Admitting: Podiatry

## 2023-11-11 ENCOUNTER — Ambulatory Visit (INDEPENDENT_AMBULATORY_CARE_PROVIDER_SITE_OTHER): Admitting: Podiatry

## 2023-11-11 DIAGNOSIS — B351 Tinea unguium: Secondary | ICD-10-CM

## 2023-11-11 MED ORDER — TERBINAFINE HCL 250 MG PO TABS: 250.0000 mg | ORAL_TABLET | Freq: Every day | ORAL | 0 refills | Status: AC

## 2023-11-15 ENCOUNTER — Encounter: Payer: Self-pay | Admitting: Podiatry

## 2023-11-15 NOTE — Progress Notes (Signed)
  Subjective:  Patient ID: April Mckee, female    DOB: 08-Apr-1991,  MRN: [id number]  Chief Complaint  Patient presents with   Nail Problem    I have a fungus and it got transferred to the other toe.  Now it's both feet.    33 y.o. female presents with the above complaint. History confirmed with patient.   Objective:  Physical Exam: warm, good capillary refill, no trophic changes or ulcerative lesions, normal DP and PT pulses, normal sensory exam, and onychomycosis.       Assessment:   1. Onychomycosis      Plan:  Patient was evaluated and treated and all questions answered.  Onychomycosis -Educated on etiology of nail fungus. -Discussed oral topical and laser therapy and risks and benefits of each -eRx for oral terbinafine  #90. Educated on risks and benefits of the medication. -Photographs taken  Return in about 14 weeks (around 02/17/2024) for follow up after nail fungus treatment.

## 2024-01-15 ENCOUNTER — Ambulatory Visit
Admission: RE | Admit: 2024-01-15 | Discharge: 2024-01-15 | Disposition: A | Discharge status: Home / Self Care | Source: Ambulatory Visit | Attending: Physician Assistant

## 2024-01-15 VITALS — BP 102/73 | HR 79 | Temp 98.2°F | Resp 18 | Wt 165.0 lb

## 2024-01-15 DIAGNOSIS — R0981 Nasal congestion: Secondary | ICD-10-CM

## 2024-01-15 DIAGNOSIS — J069 Acute upper respiratory infection, unspecified: Secondary | ICD-10-CM | POA: Diagnosis not present

## 2024-01-15 DIAGNOSIS — J029 Acute pharyngitis, unspecified: Secondary | ICD-10-CM

## 2024-01-15 LAB — SARS CORONAVIRUS 2 BY RT PCR: SARS Coronavirus 2 by RT PCR: NEGATIVE

## 2024-01-15 LAB — GROUP A STREP BY PCR: Group A Strep by PCR: NOT DETECTED

## 2024-01-15 MED ORDER — PSEUDOEPH-BROMPHEN-DM 30-2-10 MG/5ML PO SYRP: 10.0000 mL | ORAL_SOLUTION | Freq: Four times a day (QID) | ORAL | 0 refills | Status: AC | PRN

## 2024-01-15 MED ORDER — IPRATROPIUM BROMIDE 0.06 % NA SOLN: 2.0000 | Freq: Four times a day (QID) | NASAL | 0 refills | Status: AC

## 2024-01-15 NOTE — ED Triage Notes (Signed)
 Patient sttaes that her sx started Sunday night. Covid exposure.   Runny nose Sore throat Fatigue Bodyaches

## 2024-01-15 NOTE — Discharge Instructions (Addendum)
 Negative strep and COVID testing.  URI/COLD SYMPTOMS: Your exam today is consistent with a viral illness. Antibiotics are not indicated at this time. Use medications as directed, including cough syrup, nasal saline, and decongestants. Your symptoms should improve over the next few days and resolve within 7-10 days. Increase rest and fluids. F/u if symptoms worsen or predominate such as sore throat, ear pain, productive cough, shortness of breath, or if you develop high fevers or worsening fatigue over the next several days.

## 2024-01-15 NOTE — ED Provider Notes (Signed)
 [facility]    CSN: [id number] Arrival date & time: 01/15/24  1815      History   Chief Complaint Chief Complaint  Patient presents with   Sore Throat    Co-workers have COVID - Entered by patient   Nasal Congestion    HPI April Mckee is a 33 y.o. female presenting for 5 day history of nasal congestion, fatigue, cough, sore throat, and body aches. No fever, ear pain, sinus pain, chest pain, shortness of breath, vomiting or diarrhea. Has had tea, but is not taking any OTC meds. Patient has been exposed to COVID at work.   HPI  History reviewed. No pertinent past medical history.  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    Past Surgical History:  Procedure Laterality Date   DIAGNOSTIC LAPAROSCOPY WITH REMOVAL OF ECTOPIC PREGNANCY Right 09/19/2016   Procedure: DIAGNOSTIC LAPAROSCOPY WITH REMOVAL OF ECTOPIC PREGNANCY;  Surgeon: Lamar SHAUNNA Lesches, MD;  Location: [facility];  Service: Gynecology;  Laterality: Right;   LAPAROSCOPIC UNILATERAL SALPINGECTOMY Right 09/19/2016   Procedure: LAPAROSCOPIC RIGHT SALPINGECTOMY;  Surgeon: Lamar SHAUNNA Lesches, MD;  Location: [facility];  Service: Gynecology;  Laterality: Right;   NO PAST SURGERIES      OB History     Gravida  1   Para      Term      Preterm      AB  1   Living         SAB      IAB      Ectopic  1   Multiple      Live Births               Home Medications    Prior to Admission medications   Medication Sig Start Date End Date Taking? Authorizing Provider  brompheniramine-pseudoephedrine-DM 30-2-10 MG/5ML syrup Take 10 mLs by mouth 4 (four) times daily as needed for up to 7 days. 01/15/24 01/22/24 Yes Arvis Huxley B, PA-C  ipratropium (ATROVENT ) 0.06 % nasal spray Place 2 sprays into both nostrils 4 (four) times daily. 01/15/24  Yes Arvis Huxley NOVAK, PA-C  terbinafine  (LAMISIL ) 250 MG tablet Take 1 tablet (250 mg total) by mouth daily. 11/11/23  02/09/24  Silva Juliene SAUNDERS, DPM    Family History Family History  Problem Relation Age of Onset   Healthy Mother     Social History Social History   Tobacco Use   Smoking status: Never   Smokeless tobacco: Never  Vaping Use   Vaping status: Never Used  Substance Use Topics   Alcohol use: No   Drug use: No     Allergies   Patient has no known allergies.   Review of Systems Review of Systems  Constitutional:  Positive for fatigue. Negative for chills, diaphoresis and fever.  HENT:  Positive for congestion, rhinorrhea and sore throat. Negative for ear pain, sinus pressure and sinus pain.   Respiratory:  Positive for cough. Negative for shortness of breath.   Cardiovascular:  Negative for chest pain.  Gastrointestinal:  Negative for abdominal pain, nausea and vomiting.  Musculoskeletal:  Positive for myalgias.  Skin:  Negative for rash.  Neurological:  Negative for weakness and headaches.  Hematological:  Negative for adenopathy.     Physical Exam Triage Vital Signs ED Triage Vitals  Encounter Vitals Group     BP 01/15/24 1836 102/73     Girls Systolic BP Percentile --  Girls Diastolic BP Percentile --      Boys Systolic BP Percentile --      Boys Diastolic BP Percentile --      Pulse Rate 01/15/24 1836 79     Resp 01/15/24 1836 18     Temp 01/15/24 1836 98.2 F (36.8 C)     Temp Source 01/15/24 1836 Oral     SpO2 01/15/24 1836 99 %     Weight 01/15/24 1835 165 lb (74.8 kg)     Height --      Head Circumference --      Peak Flow --      Pain Score 01/15/24 1835 3     Pain Loc --      Pain Education --      Exclude from Growth Chart --    No data found.  Updated Vital Signs BP 102/73 (BP Location: Left Arm)   Pulse 79   Temp 98.2 F (36.8 C) (Oral)   Resp 18   Wt 165 lb (74.8 kg)   LMP 01/15/2024 (Exact Date)   SpO2 99%   BMI 31.18 kg/m     Physical Exam Vitals and nursing note reviewed.  Constitutional:      General: She is not in acute  distress.    Appearance: Normal appearance. She is not ill-appearing or toxic-appearing.  HENT:     Head: Normocephalic and atraumatic.     Nose: Congestion present.     Mouth/Throat:     Mouth: Mucous membranes are moist.     Pharynx: Oropharynx is clear. Posterior oropharyngeal erythema present.  Eyes:     General: No scleral icterus.       Right eye: No discharge.        Left eye: No discharge.     Conjunctiva/sclera: Conjunctivae normal.  Cardiovascular:     Rate and Rhythm: Normal rate and regular rhythm.     Heart sounds: Normal heart sounds.  Pulmonary:     Effort: Pulmonary effort is normal. No respiratory distress.     Breath sounds: Normal breath sounds.  Musculoskeletal:     Cervical back: Neck supple.  Skin:    General: Skin is dry.  Neurological:     General: No focal deficit present.     Mental Status: She is alert. Mental status is at baseline.     Motor: No weakness.     Gait: Gait normal.  Psychiatric:        Mood and Affect: Mood normal.        Behavior: Behavior normal.      UC Treatments / Results  Labs (all labs ordered are listed, but only abnormal results are displayed) Labs Reviewed  GROUP A STREP BY PCR  SARS CORONAVIRUS 2 BY RT PCR    EKG   Radiology No results found.  Procedures Procedures (including critical care time)  Medications Ordered in UC Medications - No data to display  Initial Impression / Assessment and Plan / UC Course  I have reviewed the triage vital signs and the nursing notes.  Pertinent labs & imaging results that were available during my care of the patient were reviewed by me and considered in my medical decision making (see chart for details).   33 y/o female presents for 5 day history of fatigue, cough, congestion, body aches and sore throat. No fever, chest pain or SOB. Exposed to COVID.   Vitals stable and normal. Overall well appearing. NAD. On exam, she has  nasal congestion and mild posterior pharyngeal  erythema. Chest clear. Heart RRR.  Negative strep and COVID testing.   Viral URI. Supportive care. Sent Bromfed DM and atrovent nasal spray. Reviewed typical course of most viral illnesses. Reviewed return precautions.   Final Clinical Impressions(s) / UC Diagnoses   Final diagnoses:  Viral upper respiratory tract infection  Nasal congestion  Sore throat     Discharge Instructions      Negative strep and COVID testing.  URI/COLD SYMPTOMS: Your exam today is consistent with a viral illness. Antibiotics are not indicated at this time. Use medications as directed, including cough syrup, nasal saline, and decongestants. Your symptoms should improve over the next few days and resolve within 7-10 days. Increase rest and fluids. F/u if symptoms worsen or predominate such as sore throat, ear pain, productive cough, shortness of breath, or if you develop high fevers or worsening fatigue over the next several days.       ED Prescriptions     Medication Sig Dispense Auth. Provider   brompheniramine-pseudoephedrine-DM 30-2-10 MG/5ML syrup Take 10 mLs by mouth 4 (four) times daily as needed for up to 7 days. 150 mL Arvis Huxley B, PA-C   ipratropium (ATROVENT) 0.06 % nasal spray Place 2 sprays into both nostrils 4 (four) times daily. 15 mL Arvis Huxley NOVAK, PA-C      PDMP not reviewed this encounter.   Arvis Huxley NOVAK, PA-C 01/15/24 1923

## 2024-02-07 ENCOUNTER — Other Ambulatory Visit: Payer: Self-pay | Admitting: Podiatry

## 2024-02-08 ENCOUNTER — Inpatient Hospital Stay
Admission: RE | Admit: 2024-02-08 | Discharge: 2024-02-08 | Discharge status: Home / Self Care | Payer: Self-pay | Source: Ambulatory Visit | Attending: Emergency Medicine | Admitting: Emergency Medicine

## 2024-02-08 VITALS — BP 112/75 | HR 63 | Temp 98.0°F | Resp 18

## 2024-02-08 DIAGNOSIS — S060X0A Concussion without loss of consciousness, initial encounter: Secondary | ICD-10-CM | POA: Diagnosis not present

## 2024-02-08 DIAGNOSIS — S0990XA Unspecified injury of head, initial encounter: Secondary | ICD-10-CM

## 2024-02-08 MED ORDER — IBUPROFEN 600 MG PO TABS: 600.0000 mg | ORAL_TABLET | Freq: Four times a day (QID) | ORAL | 0 refills | Status: AC | PRN
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
## 2024-02-08 NOTE — ED Triage Notes (Addendum)
 Patient states that she was getting a bath sat night and bumped her head. Patient states that she was bleeding. No longer bleeding or on blood thinners. Patient states that she she's having some nausea and fatigue. Patient states that this happened around 330 in the morning. Didn't want to go the ed because she was tired.

## 2024-02-08 NOTE — Discharge Instructions (Signed)
 You appear to have a concussion.  Cognitive rest, limit screen time.  May take 600 mg ibuprofen , 1000 mg of Tylenol  together 3-4 times a day as needed for pain.  Please follow-up with Dr. Alvia ASAP for further management.  Go to the emergency department for the signs and symptoms we discussed

## 2024-02-17 ENCOUNTER — Ambulatory Visit: Admitting: Podiatry
